# Patient Record
Sex: Male | Born: 1950 | Race: White | Hispanic: No | Marital: Married | Smoking: Never smoker
Health system: Southern US, Community
[De-identification: ages and names within clinical notes are randomized; demographics above are authoritative.]

## PROBLEM LIST (undated history)

## (undated) DIAGNOSIS — N289 Disorder of kidney and ureter, unspecified: Secondary | ICD-10-CM

## (undated) DIAGNOSIS — Z950 Presence of cardiac pacemaker: Secondary | ICD-10-CM

## (undated) DIAGNOSIS — I340 Nonrheumatic mitral (valve) insufficiency: Secondary | ICD-10-CM

## (undated) DIAGNOSIS — F329 Major depressive disorder, single episode, unspecified: Secondary | ICD-10-CM

## (undated) DIAGNOSIS — I10 Essential (primary) hypertension: Secondary | ICD-10-CM

## (undated) DIAGNOSIS — I639 Cerebral infarction, unspecified: Secondary | ICD-10-CM

## (undated) DIAGNOSIS — M170 Bilateral primary osteoarthritis of knee: Secondary | ICD-10-CM

## (undated) DIAGNOSIS — T82857A Stenosis of cardiac prosthetic devices, implants and grafts, initial encounter: Secondary | ICD-10-CM

## (undated) DIAGNOSIS — D72829 Elevated white blood cell count, unspecified: Secondary | ICD-10-CM

## (undated) DIAGNOSIS — I499 Cardiac arrhythmia, unspecified: Secondary | ICD-10-CM

## (undated) DIAGNOSIS — E785 Hyperlipidemia, unspecified: Secondary | ICD-10-CM

## (undated) DIAGNOSIS — M1990 Unspecified osteoarthritis, unspecified site: Secondary | ICD-10-CM

## (undated) DIAGNOSIS — I38 Endocarditis, valve unspecified: Secondary | ICD-10-CM

## (undated) DIAGNOSIS — H532 Diplopia: Secondary | ICD-10-CM

## (undated) DIAGNOSIS — F32A Depression, unspecified: Secondary | ICD-10-CM

## (undated) DIAGNOSIS — N19 Unspecified kidney failure: Secondary | ICD-10-CM

## (undated) DIAGNOSIS — G40209 Localization-related (focal) (partial) symptomatic epilepsy and epileptic syndromes with complex partial seizures, not intractable, without status epilepticus: Secondary | ICD-10-CM

## (undated) DIAGNOSIS — Z961 Presence of intraocular lens: Secondary | ICD-10-CM

## (undated) HISTORY — PX: TONSILLECTOMY: SUR1361

## (undated) HISTORY — PX: HEMORRHOID SURGERY: SHX153

## (undated) HISTORY — PX: COLONOSCOPY: SHX174

## (undated) HISTORY — PX: INSERT / REPLACE / REMOVE PACEMAKER: SUR710

## (undated) HISTORY — PX: EYE SURGERY: SHX253

---

## 2007-02-09 ENCOUNTER — Ambulatory Visit: Payer: Self-pay | Admitting: Unknown Physician Specialty

## 2007-03-28 ENCOUNTER — Inpatient Hospital Stay: Payer: Self-pay | Admitting: Internal Medicine

## 2007-03-28 ENCOUNTER — Other Ambulatory Visit: Payer: Self-pay

## 2007-11-27 ENCOUNTER — Ambulatory Visit: Payer: Self-pay | Admitting: Internal Medicine

## 2008-03-16 ENCOUNTER — Emergency Department: Payer: Self-pay | Admitting: Emergency Medicine

## 2008-11-21 ENCOUNTER — Ambulatory Visit: Payer: Self-pay | Admitting: Unknown Physician Specialty

## 2009-07-13 ENCOUNTER — Ambulatory Visit: Payer: Self-pay | Admitting: Internal Medicine

## 2009-08-01 ENCOUNTER — Ambulatory Visit: Payer: Self-pay | Admitting: Internal Medicine

## 2011-07-31 DIAGNOSIS — I352 Nonrheumatic aortic (valve) stenosis with insufficiency: Secondary | ICD-10-CM | POA: Insufficient documentation

## 2011-07-31 DIAGNOSIS — I340 Nonrheumatic mitral (valve) insufficiency: Secondary | ICD-10-CM | POA: Insufficient documentation

## 2011-07-31 DIAGNOSIS — F32A Depression, unspecified: Secondary | ICD-10-CM | POA: Insufficient documentation

## 2011-07-31 DIAGNOSIS — F329 Major depressive disorder, single episode, unspecified: Secondary | ICD-10-CM | POA: Insufficient documentation

## 2011-07-31 DIAGNOSIS — E785 Hyperlipidemia, unspecified: Secondary | ICD-10-CM | POA: Insufficient documentation

## 2011-07-31 DIAGNOSIS — I071 Rheumatic tricuspid insufficiency: Secondary | ICD-10-CM | POA: Insufficient documentation

## 2011-10-28 HISTORY — PX: CARDIAC VALVE REPLACEMENT: SHX585

## 2011-11-04 HISTORY — PX: AORTIC VALVE REPLACEMENT: SHX41

## 2011-12-10 ENCOUNTER — Inpatient Hospital Stay: Payer: Self-pay | Admitting: Internal Medicine

## 2011-12-10 ENCOUNTER — Ambulatory Visit: Payer: Self-pay | Admitting: Internal Medicine

## 2011-12-10 LAB — COMPREHENSIVE METABOLIC PANEL
Albumin: 3.5 g/dL (ref 3.4–5.0)
Alkaline Phosphatase: 71 U/L (ref 50–136)
Anion Gap: 9 (ref 7–16)
BUN: 25 mg/dL — ABNORMAL HIGH (ref 7–18)
Calcium, Total: 9.2 mg/dL (ref 8.5–10.1)
Creatinine: 1.26 mg/dL (ref 0.60–1.30)
Osmolality: 285 (ref 275–301)
Potassium: 3.8 mmol/L (ref 3.5–5.1)
Sodium: 140 mmol/L (ref 136–145)
Total Protein: 8.1 g/dL (ref 6.4–8.2)

## 2011-12-10 LAB — PROTIME-INR: Prothrombin Time: 14.3 secs (ref 11.5–14.7)

## 2011-12-10 LAB — CBC WITH DIFFERENTIAL/PLATELET
Basophil #: 0.1 10*3/uL (ref 0.0–0.1)
HCT: 31.6 % — ABNORMAL LOW (ref 40.0–52.0)
Lymphocyte #: 0.7 10*3/uL — ABNORMAL LOW (ref 1.0–3.6)
MCH: 31.2 pg (ref 26.0–34.0)
MCHC: 34.1 g/dL (ref 32.0–36.0)
Monocyte #: 0.6 10*3/uL (ref 0.0–0.7)
Monocyte %: 10.5 %
Neutrophil #: 4 10*3/uL (ref 1.4–6.5)
Neutrophil %: 65.1 %
Platelet: 130 10*3/uL — ABNORMAL LOW (ref 150–440)
RBC: 3.46 10*6/uL — ABNORMAL LOW (ref 4.40–5.90)
RDW: 15.4 % — ABNORMAL HIGH (ref 11.5–14.5)
WBC: 6.1 10*3/uL (ref 3.8–10.6)

## 2011-12-10 LAB — TROPONIN I: Troponin-I: <0.02 ng/mL

## 2011-12-10 LAB — CK TOTAL AND CKMB (NOT AT ARMC): CK, Total: 37 U/L (ref 35–232)

## 2011-12-11 LAB — BASIC METABOLIC PANEL
BUN: 20 mg/dL — ABNORMAL HIGH (ref 7–18)
Chloride: 106 mmol/L (ref 98–107)
Co2: 23 mmol/L (ref 21–32)
EGFR (Non-African Amer.): >60
Osmolality: 286 (ref 275–301)
Potassium: 3.9 mmol/L (ref 3.5–5.1)

## 2011-12-11 LAB — CBC WITH DIFFERENTIAL/PLATELET
Basophil %: 1.8 %
Eosinophil %: 11.6 %
HCT: 30.2 % — ABNORMAL LOW (ref 40.0–52.0)
HGB: 10.4 g/dL — ABNORMAL LOW (ref 13.0–18.0)
Lymphocyte %: 17.6 %
MCH: 31.2 pg (ref 26.0–34.0)
MCV: 90 fL (ref 80–100)
Monocyte #: 0.7 10*3/uL (ref 0.0–0.7)
Monocyte %: 14.1 %
Neutrophil %: 54.9 %
Platelet: 108 10*3/uL — ABNORMAL LOW (ref 150–440)
RBC: 3.34 10*6/uL — ABNORMAL LOW (ref 4.40–5.90)
RDW: 15.5 % — ABNORMAL HIGH (ref 11.5–14.5)
WBC: 5.2 10*3/uL (ref 3.8–10.6)

## 2011-12-11 LAB — URINALYSIS, COMPLETE
Bacteria: NONE SEEN
Blood: NEGATIVE
Glucose,UR: NEGATIVE mg/dL (ref 0–75)
Ketone: NEGATIVE
Ph: 6 (ref 4.5–8.0)
RBC,UR: <1 /HPF (ref 0–5)
Squamous Epithelial: NONE SEEN
WBC UR: 1 /HPF (ref 0–5)

## 2011-12-11 LAB — TROPONIN I
Troponin-I: <0.02 ng/mL
Troponin-I: <0.02 ng/mL

## 2011-12-11 LAB — CK TOTAL AND CKMB (NOT AT ARMC)
CK, Total: 30 U/L — ABNORMAL LOW (ref 35–232)
CK, Total: 33 U/L — ABNORMAL LOW (ref 35–232)
CK-MB: <0.5 ng/mL — ABNORMAL LOW (ref 0.5–3.6)

## 2011-12-11 LAB — APTT: Activated PTT: >160 secs (ref 23.6–35.9)

## 2011-12-11 LAB — CLOSTRIDIUM DIFFICILE BY PCR

## 2011-12-11 LAB — PROTIME-INR: INR: 1.1

## 2011-12-12 LAB — APTT: Activated PTT: 78.1 secs — ABNORMAL HIGH (ref 23.6–35.9)

## 2011-12-12 LAB — PROTIME-INR: Prothrombin Time: 16.2 secs — ABNORMAL HIGH (ref 11.5–14.7)

## 2011-12-13 LAB — CBC WITH DIFFERENTIAL/PLATELET
Basophil #: 0.1 10*3/uL (ref 0.0–0.1)
Eosinophil #: 0.7 10*3/uL (ref 0.0–0.7)
Eosinophil %: 14.2 %
HCT: 32.1 % — ABNORMAL LOW (ref 40.0–52.0)
HGB: 11.1 g/dL — ABNORMAL LOW (ref 13.0–18.0)
Lymphocyte #: 0.7 10*3/uL — ABNORMAL LOW (ref 1.0–3.6)
Lymphocyte %: 15.7 %
MCH: 30.8 pg (ref 26.0–34.0)
MCHC: 34.4 g/dL (ref 32.0–36.0)
MCV: 90 fL (ref 80–100)
Monocyte #: 0.7 10*3/uL (ref 0.0–0.7)
Neutrophil #: 2.4 10*3/uL (ref 1.4–6.5)
RBC: 3.59 10*6/uL — ABNORMAL LOW (ref 4.40–5.90)
RDW: 15.5 % — ABNORMAL HIGH (ref 11.5–14.5)

## 2011-12-13 LAB — PROTIME-INR: INR: 1.4

## 2011-12-13 LAB — COMPREHENSIVE METABOLIC PANEL
Albumin: 3 g/dL — ABNORMAL LOW (ref 3.4–5.0)
Alkaline Phosphatase: 71 U/L (ref 50–136)
Anion Gap: 13 (ref 7–16)
BUN: 17 mg/dL (ref 7–18)
Chloride: 106 mmol/L (ref 98–107)
Creatinine: 1.31 mg/dL — ABNORMAL HIGH (ref 0.60–1.30)
EGFR (African American): >60
Glucose: 109 mg/dL — ABNORMAL HIGH (ref 65–99)
Osmolality: 285 (ref 275–301)
Potassium: 3.9 mmol/L (ref 3.5–5.1)
SGOT(AST): 33 U/L (ref 15–37)
SGPT (ALT): 34 U/L
Total Protein: 7.3 g/dL (ref 6.4–8.2)

## 2011-12-13 LAB — LIPASE, BLOOD: Lipase: 181 U/L (ref 73–393)

## 2011-12-13 LAB — CLOSTRIDIUM DIFFICILE BY PCR

## 2011-12-14 LAB — COMPREHENSIVE METABOLIC PANEL
Albumin: 3.1 g/dL — ABNORMAL LOW (ref 3.4–5.0)
Alkaline Phosphatase: 65 U/L (ref 50–136)
BUN: 18 mg/dL (ref 7–18)
Bilirubin,Total: 1.4 mg/dL — ABNORMAL HIGH (ref 0.2–1.0)
Chloride: 107 mmol/L (ref 98–107)
Creatinine: 1.28 mg/dL (ref 0.60–1.30)
EGFR (African American): >60
Osmolality: 287 (ref 275–301)
SGPT (ALT): 34 U/L
Total Protein: 6.6 g/dL (ref 6.4–8.2)

## 2011-12-14 LAB — CBC WITH DIFFERENTIAL/PLATELET
Basophil %: 1.8 %
Eosinophil %: 15.3 %
Lymphocyte %: 17.4 %
MCH: 31.1 pg (ref 26.0–34.0)
MCHC: 34.2 g/dL (ref 32.0–36.0)
MCV: 91 fL (ref 80–100)
Monocyte %: 15.3 %
Neutrophil #: 2 10*3/uL (ref 1.4–6.5)
Neutrophil %: 50.2 %
Platelet: 117 10*3/uL — ABNORMAL LOW (ref 150–440)
RBC: 3.42 10*6/uL — ABNORMAL LOW (ref 4.40–5.90)

## 2011-12-14 LAB — APTT: Activated PTT: 114.2 secs — ABNORMAL HIGH (ref 23.6–35.9)

## 2011-12-15 LAB — COMPREHENSIVE METABOLIC PANEL
Albumin: 3.1 g/dL — ABNORMAL LOW (ref 3.4–5.0)
Anion Gap: 14 (ref 7–16)
BUN: 17 mg/dL (ref 7–18)
Bilirubin,Total: 1.4 mg/dL — ABNORMAL HIGH (ref 0.2–1.0)
Calcium, Total: 9.1 mg/dL (ref 8.5–10.1)
Chloride: 105 mmol/L (ref 98–107)
Creatinine: 1.24 mg/dL (ref 0.60–1.30)
EGFR (African American): >60
Osmolality: 287 (ref 275–301)
Potassium: 3.8 mmol/L (ref 3.5–5.1)
SGPT (ALT): 40 U/L
Total Protein: 7.1 g/dL (ref 6.4–8.2)

## 2011-12-15 LAB — CBC WITH DIFFERENTIAL/PLATELET
Basophil #: 0.1 10*3/uL (ref 0.0–0.1)
Basophil %: 1.5 %
Eosinophil #: 0.6 10*3/uL (ref 0.0–0.7)
Eosinophil %: 13 %
HGB: 10.5 g/dL — ABNORMAL LOW (ref 13.0–18.0)
MCH: 31 pg (ref 26.0–34.0)
MCHC: 34.3 g/dL (ref 32.0–36.0)
MCV: 91 fL (ref 80–100)
Monocyte #: 0.7 10*3/uL (ref 0.0–0.7)
Neutrophil #: 2.4 10*3/uL (ref 1.4–6.5)
Neutrophil %: 54.5 %
RDW: 15.7 % — ABNORMAL HIGH (ref 11.5–14.5)

## 2011-12-15 LAB — PROTIME-INR: Prothrombin Time: 17 secs — ABNORMAL HIGH (ref 11.5–14.7)

## 2012-01-07 ENCOUNTER — Encounter: Payer: Self-pay | Admitting: Cardiothoracic Surgery

## 2012-01-11 ENCOUNTER — Emergency Department: Payer: Self-pay | Admitting: Emergency Medicine

## 2012-01-11 LAB — COMPREHENSIVE METABOLIC PANEL
Albumin: 3.7 g/dL (ref 3.4–5.0)
Alkaline Phosphatase: 80 U/L (ref 50–136)
Anion Gap: 13 (ref 7–16)
BUN: 24 mg/dL — ABNORMAL HIGH (ref 7–18)
Bilirubin,Total: 0.8 mg/dL (ref 0.2–1.0)
Chloride: 106 mmol/L (ref 98–107)
Creatinine: 1.17 mg/dL (ref 0.60–1.30)
EGFR (African American): >60
Glucose: 108 mg/dL — ABNORMAL HIGH (ref 65–99)
Potassium: 4.2 mmol/L (ref 3.5–5.1)
SGOT(AST): 35 U/L (ref 15–37)
SGPT (ALT): 33 U/L
Total Protein: 8 g/dL (ref 6.4–8.2)

## 2012-01-11 LAB — CK TOTAL AND CKMB (NOT AT ARMC): CK-MB: 0.6 ng/mL (ref 0.5–3.6)

## 2012-01-11 LAB — PROTIME-INR: Prothrombin Time: 14.1 secs (ref 11.5–14.7)

## 2012-01-11 LAB — TROPONIN I: Troponin-I: <0.02 ng/mL

## 2012-01-11 LAB — CBC
HCT: 33.8 % — ABNORMAL LOW (ref 40.0–52.0)
HGB: 11.8 g/dL — ABNORMAL LOW (ref 13.0–18.0)
MCHC: 34.8 g/dL (ref 32.0–36.0)
Platelet: 110 10*3/uL — ABNORMAL LOW (ref 150–440)
WBC: 7.1 10*3/uL (ref 3.8–10.6)

## 2012-01-13 DIAGNOSIS — Z952 Presence of prosthetic heart valve: Secondary | ICD-10-CM | POA: Insufficient documentation

## 2012-01-26 ENCOUNTER — Encounter: Payer: Self-pay | Admitting: Cardiothoracic Surgery

## 2012-02-02 ENCOUNTER — Ambulatory Visit: Payer: Self-pay | Admitting: Internal Medicine

## 2012-02-02 LAB — CREATININE, SERUM
Creatinine: 1.25 mg/dL (ref 0.60–1.30)
EGFR (African American): >60
EGFR (Non-African Amer.): >60

## 2012-02-04 ENCOUNTER — Observation Stay: Payer: Self-pay | Admitting: Internal Medicine

## 2012-02-04 LAB — COMPREHENSIVE METABOLIC PANEL
Albumin: 3.5 g/dL (ref 3.4–5.0)
Alkaline Phosphatase: 89 U/L (ref 50–136)
BUN: 21 mg/dL — ABNORMAL HIGH (ref 7–18)
Chloride: 102 mmol/L (ref 98–107)
Creatinine: 1.06 mg/dL (ref 0.60–1.30)
EGFR (African American): >60
EGFR (Non-African Amer.): >60
Glucose: 108 mg/dL — ABNORMAL HIGH (ref 65–99)
Osmolality: 279 (ref 275–301)
Potassium: 4 mmol/L (ref 3.5–5.1)
SGOT(AST): 38 U/L — ABNORMAL HIGH (ref 15–37)
Sodium: 138 mmol/L (ref 136–145)
Total Protein: 7.4 g/dL (ref 6.4–8.2)

## 2012-02-04 LAB — TROPONIN I
Troponin-I: <0.02 ng/mL
Troponin-I: <0.02 ng/mL

## 2012-02-04 LAB — CBC
HCT: 35.5 % — ABNORMAL LOW (ref 40.0–52.0)
MCH: 30.5 pg (ref 26.0–34.0)
MCHC: 34.4 g/dL (ref 32.0–36.0)
MCV: 89 fL (ref 80–100)
Platelet: 143 10*3/uL — ABNORMAL LOW (ref 150–440)
RDW: 13.3 % (ref 11.5–14.5)
WBC: 12 10*3/uL — ABNORMAL HIGH (ref 3.8–10.6)

## 2012-02-04 LAB — CK TOTAL AND CKMB (NOT AT ARMC)
CK, Total: 50 U/L (ref 35–232)
CK, Total: 48 U/L (ref 35–232)
CK-MB: <0.5 ng/mL — ABNORMAL LOW (ref 0.5–3.6)

## 2012-02-05 LAB — CBC WITH DIFFERENTIAL/PLATELET
Basophil #: 0.1 10*3/uL (ref 0.0–0.1)
Eosinophil #: 0.2 10*3/uL (ref 0.0–0.7)
Eosinophil %: 1.6 %
Lymphocyte %: 10.7 %
MCHC: 34.2 g/dL (ref 32.0–36.0)
Monocyte #: 1.4 x10 3/mm — ABNORMAL HIGH (ref 0.2–1.0)
Neutrophil %: 76.3 %
Platelet: 150 10*3/uL (ref 150–440)
RBC: 4.28 10*6/uL — ABNORMAL LOW (ref 4.40–5.90)

## 2012-02-05 LAB — BASIC METABOLIC PANEL
Anion Gap: 7 (ref 7–16)
Calcium, Total: 9.6 mg/dL (ref 8.5–10.1)
Co2: 30 mmol/L (ref 21–32)
Creatinine: 1.07 mg/dL (ref 0.60–1.30)
EGFR (African American): >60
EGFR (Non-African Amer.): >60
Glucose: 108 mg/dL — ABNORMAL HIGH (ref 65–99)
Osmolality: 274 (ref 275–301)
Potassium: 4.2 mmol/L (ref 3.5–5.1)
Sodium: 136 mmol/L (ref 136–145)

## 2012-02-05 LAB — CK TOTAL AND CKMB (NOT AT ARMC): CK-MB: <0.5 ng/mL — ABNORMAL LOW (ref 0.5–3.6)

## 2012-02-05 LAB — PROTIME-INR: Prothrombin Time: 16.6 secs — ABNORMAL HIGH (ref 11.5–14.7)

## 2012-02-05 LAB — TROPONIN I: Troponin-I: <0.02 ng/mL

## 2012-02-06 LAB — BASIC METABOLIC PANEL
Anion Gap: 9 (ref 7–16)
Calcium, Total: 9.5 mg/dL (ref 8.5–10.1)
Chloride: 101 mmol/L (ref 98–107)
Creatinine: 1.11 mg/dL (ref 0.60–1.30)
EGFR (African American): >60
EGFR (Non-African Amer.): >60
Glucose: 98 mg/dL (ref 65–99)
Potassium: 4.1 mmol/L (ref 3.5–5.1)
Sodium: 137 mmol/L (ref 136–145)

## 2012-02-06 LAB — CBC WITH DIFFERENTIAL/PLATELET
Basophil %: 0.7 %
Eosinophil #: 0.2 10*3/uL (ref 0.0–0.7)
HCT: 38.5 % — ABNORMAL LOW (ref 40.0–52.0)
HGB: 13.2 g/dL (ref 13.0–18.0)
Lymphocyte #: 1.1 10*3/uL (ref 1.0–3.6)
Lymphocyte %: 12.8 %
MCHC: 34.3 g/dL (ref 32.0–36.0)
MCV: 88 fL (ref 80–100)
Neutrophil #: 5.9 10*3/uL (ref 1.4–6.5)
Neutrophil %: 71.9 %
Platelet: 151 10*3/uL (ref 150–440)
RBC: 4.39 10*6/uL — ABNORMAL LOW (ref 4.40–5.90)
RDW: 13.4 % (ref 11.5–14.5)
WBC: 8.3 10*3/uL (ref 3.8–10.6)

## 2012-02-25 ENCOUNTER — Encounter: Payer: Self-pay | Admitting: Cardiothoracic Surgery

## 2012-05-19 IMAGING — CR DG CHEST 2V
1 series · 2 of 2 positions shown · non-contrast
Comparison: none

REASON FOR EXAM: hx of AVR, Rt leg DVT
COMMENTS:

[Series 1: pa · 0.17mm/px · 2 of 2 slices shown]
[im 1/2]
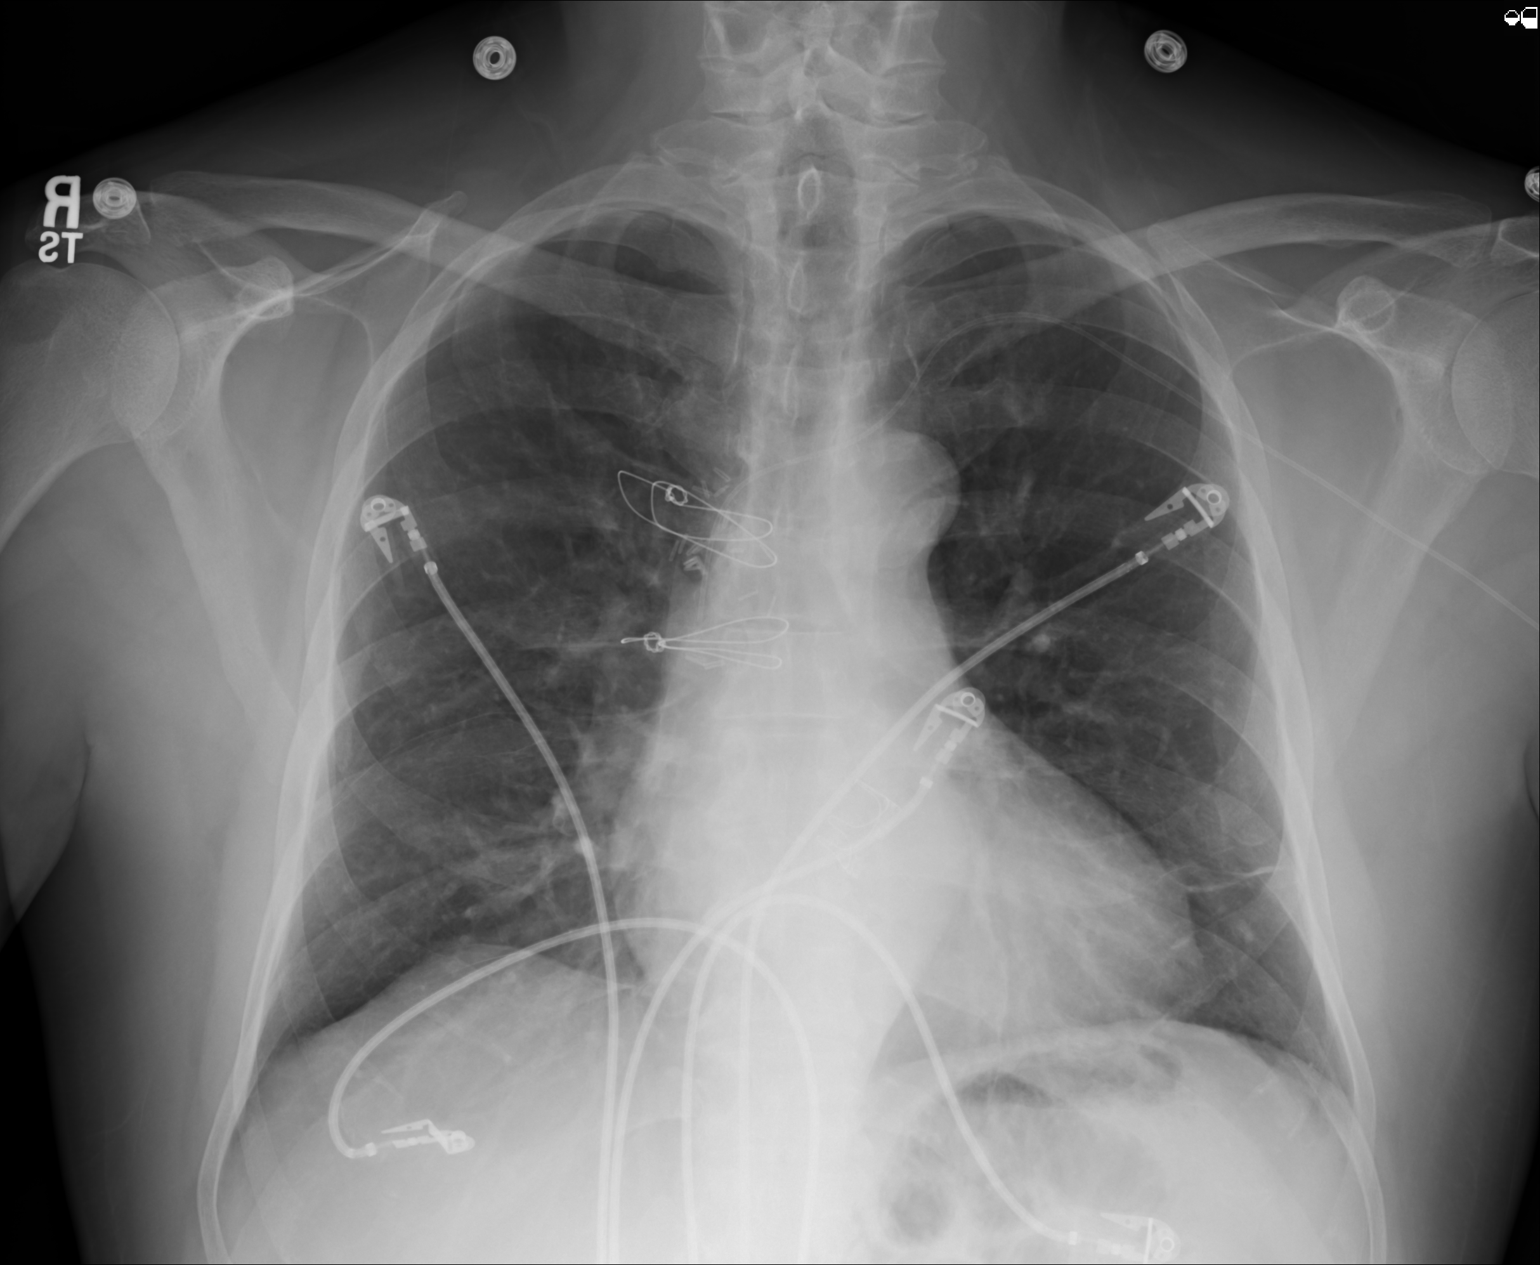
[im 2/2]
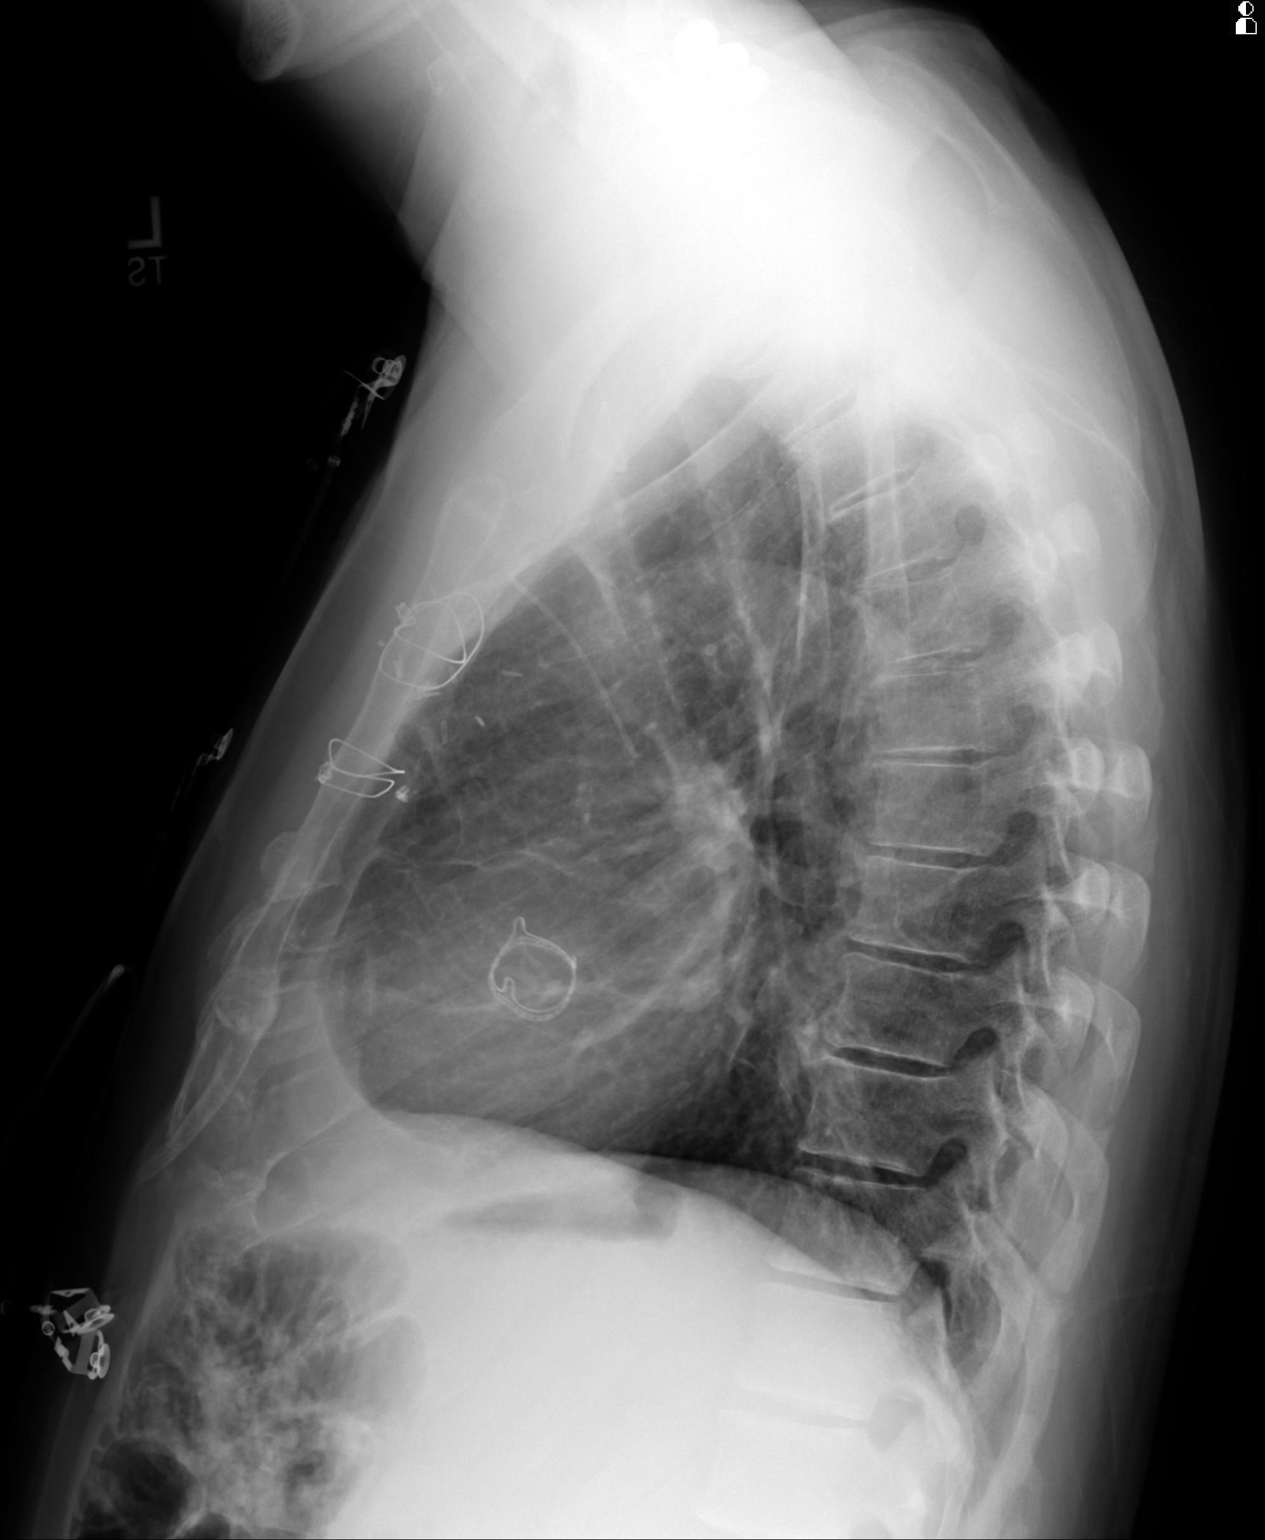

[2 of 2 positions shown; findings below may reference images not displayed]

PROCEDURE:     DXR - DXR CHEST PA (OR AP) AND LATERAL  - December 10, 2011  [DATE]

RESULT:     There is no previous exam for comparison.

Heart is mildly enlarged. There is a peripherally inserted central catheter
from the left upper extremity with the tip in the superior vena cava. There
is no edema, infiltrate, effusion or pneumothorax. Prosthetic cardiac valve
is noted. The bony structures appear intact.
IMPRESSION: Mild cardiomegaly. No acute cardiopulmonary disease evident.

## 2012-06-15 ENCOUNTER — Ambulatory Visit: Payer: Self-pay | Admitting: Internal Medicine

## 2012-09-13 ENCOUNTER — Ambulatory Visit: Payer: Self-pay | Admitting: Unknown Physician Specialty

## 2012-09-14 LAB — PATHOLOGY REPORT

## 2012-12-22 HISTORY — PX: OTHER SURGICAL HISTORY: SHX169

## 2013-01-31 ENCOUNTER — Emergency Department: Payer: Self-pay | Admitting: Emergency Medicine

## 2013-01-31 LAB — CBC
HCT: 39 % — ABNORMAL LOW (ref 40.0–52.0)
HGB: 13.7 g/dL (ref 13.0–18.0)
MCH: 31.5 pg (ref 26.0–34.0)
MCV: 90 fL (ref 80–100)
Platelet: 118 10*3/uL — ABNORMAL LOW (ref 150–440)
RBC: 4.35 10*6/uL — ABNORMAL LOW (ref 4.40–5.90)

## 2013-01-31 LAB — BASIC METABOLIC PANEL
Anion Gap: 14 (ref 7–16)
BUN: 21 mg/dL — ABNORMAL HIGH (ref 7–18)
Calcium, Total: 9.2 mg/dL (ref 8.5–10.1)
Co2: 20 mmol/L — ABNORMAL LOW (ref 21–32)
EGFR (African American): 58 — ABNORMAL LOW
EGFR (Non-African Amer.): 50 — ABNORMAL LOW
Glucose: 166 mg/dL — ABNORMAL HIGH (ref 65–99)
Potassium: 3.7 mmol/L (ref 3.5–5.1)
Sodium: 137 mmol/L (ref 136–145)

## 2013-03-03 ENCOUNTER — Ambulatory Visit: Payer: Self-pay | Admitting: Neurology

## 2013-03-03 LAB — CREATININE, SERUM
Creatinine: 1.37 mg/dL — ABNORMAL HIGH (ref 0.60–1.30)
EGFR (Non-African Amer.): 55 — ABNORMAL LOW

## 2013-06-27 HISTORY — PX: OTHER SURGICAL HISTORY: SHX169

## 2013-09-27 HISTORY — PX: OTHER SURGICAL HISTORY: SHX169

## 2013-11-10 DIAGNOSIS — Z8673 Personal history of transient ischemic attack (TIA), and cerebral infarction without residual deficits: Secondary | ICD-10-CM | POA: Insufficient documentation

## 2013-11-11 DIAGNOSIS — G40209 Localization-related (focal) (partial) symptomatic epilepsy and epileptic syndromes with complex partial seizures, not intractable, without status epilepticus: Secondary | ICD-10-CM | POA: Insufficient documentation

## 2014-04-14 ENCOUNTER — Inpatient Hospital Stay: Payer: Self-pay | Admitting: Internal Medicine

## 2014-04-14 LAB — DRUG SCREEN, URINE

## 2014-04-14 LAB — ETHANOL

## 2014-04-14 LAB — CBC
HCT: 45.6 % (ref 40.0–52.0)
HGB: 15.8 g/dL (ref 13.0–18.0)
MCH: 31.8 pg (ref 26.0–34.0)
MCHC: 34.6 g/dL (ref 32.0–36.0)
MCV: 92 fL (ref 80–100)
Platelet: 106 10*3/uL — ABNORMAL LOW (ref 150–440)
RBC: 4.95 10*6/uL (ref 4.40–5.90)
RDW: 13.1 % (ref 11.5–14.5)
WBC: 9.6 10*3/uL (ref 3.8–10.6)

## 2014-04-14 LAB — COMPREHENSIVE METABOLIC PANEL
ALK PHOS: 85 U/L
Albumin: 4.4 g/dL (ref 3.4–5.0)
Anion Gap: 10 (ref 7–16)
BILIRUBIN TOTAL: 1.2 mg/dL — AB (ref 0.2–1.0)
BUN: 27 mg/dL — ABNORMAL HIGH (ref 7–18)
CREATININE: 1.13 mg/dL (ref 0.60–1.30)
Calcium, Total: 9.4 mg/dL (ref 8.5–10.1)
Chloride: 103 mmol/L (ref 98–107)
Co2: 24 mmol/L (ref 21–32)
Glucose: 88 mg/dL (ref 65–99)
Osmolality: 278 (ref 275–301)
Potassium: 5.3 mmol/L — ABNORMAL HIGH (ref 3.5–5.1)
SGOT(AST): 68 U/L — ABNORMAL HIGH (ref 15–37)
SGPT (ALT): 37 U/L (ref 12–78)
SODIUM: 137 mmol/L (ref 136–145)
Total Protein: 8.6 g/dL — ABNORMAL HIGH (ref 6.4–8.2)

## 2014-04-14 LAB — ACETAMINOPHEN LEVEL

## 2014-04-14 LAB — SALICYLATE LEVEL: Salicylates, Serum: <1.7 mg/dL

## 2014-07-24 ENCOUNTER — Ambulatory Visit: Payer: Self-pay | Admitting: Internal Medicine

## 2014-10-23 DIAGNOSIS — H5034 Intermittent alternating exotropia: Secondary | ICD-10-CM | POA: Insufficient documentation

## 2014-11-16 DIAGNOSIS — I10 Essential (primary) hypertension: Secondary | ICD-10-CM | POA: Insufficient documentation

## 2014-12-06 DIAGNOSIS — I38 Endocarditis, valve unspecified: Secondary | ICD-10-CM | POA: Insufficient documentation

## 2015-01-05 DIAGNOSIS — M170 Bilateral primary osteoarthritis of knee: Secondary | ICD-10-CM | POA: Insufficient documentation

## 2015-02-17 NOTE — Discharge Summary (Signed)
PATIENT NAME:  Johnny [name], Johnny [name] MR#:  [id number] DATE OF BIRTH:  03-15-1951  DATE OF ADMISSION:  04/14/2014 DATE OF DISCHARGE:  04/14/2014  DIAGNOSES AT TIME OF DISCHARGE: 1.  Acute seizure.  2.  Hyperkalemia.  3.  Hypertension.  4.  Hyperlipidemia.  5.  History of vitamin D deficiency.  6.  Anxiety.    CHIEF COMPLAINT: Seizures.   HISTORY OF PRESENT ILLNESS: The patient is a 64 year old male with a past medical history significant for [facility], recent seizures, who presented to the ED after another episode of seizure. The patient's first episode was in April 2014 and the second one a year later in April 2015. The patient had been seen by neurologist, Dr. Sherryll BurgerShah, and EEG showed evidence of seizure waves. The patient himself had declined seizure medications until this time, but, however, a seizure was witnessed by his wife at around 2:30 a.m., when the patient had a tonic-clonic seizure while he was sleeping in bed and had transient global amnesia afterward. He did not sustain  injuries. He was brought to the ED. CT head was negative. He was given 1 gram of IV Cerebyx and was also seen by Dr. Sherryll BurgerShah as a courtesy visit, who advised that he can be discharged on Keppra.   PAST MEDICAL HISTORY: Significant for hypertension, hyperlipidemia, history of bovine aortic valve replacement, history of previous deep vein thrombosis, history of childhood asthma, history of aortic valve stenosis, marantic endocarditis, bovine aortic valve replacement, and tonsillectomy. Please see H and P for other details.   HOSPITAL COURSE: The patient did not have any further episodes of seizures, and he was stable during his stay in the hospital. He was discharged in stable condition on the following medications: Keppra 1000 mg p.o. b.i.d. for three days followed by Keppra 500 mg 1 tablet p.o. b.i.d., diphenhydramine 25 mg orally as needed, ketorolac 0.5% ophthalmic solution one drop each eye 4 times  a day, aspirin 81 mg a day, Lipitor 10 mg once a day at bedtime, vitamin D3 1000 units 1 capsule once a day, fish oil 1 capsule on Tuesday, Thursday, on Saturday, multivitamin 1 tablet once a day. The patient was advised on a low-sodium diet and follow up with me, Dr. Marcello FennelHande, and also follow up with Dr. Sherryll BurgerShah, the neurologist, in 1 to 2 weeks' time. He was advised to call back with any questions or concerns. He was stable at the time of discharge.   TOTAL TIME SPENT DISCHARGING PATIENT: 30 minutes    ____________________________ Barbette ReichmannVishwanath Hande, MD vh:cg D: 04/17/2014 19:11:07 ET T: 04/18/2014 04:50:08 ET JOB#: [id number]  cc: Barbette ReichmannVishwanath Hande, MD, <Dictator> Barbette ReichmannVISHWANATH HANDE MD ELECTRONICALLY SIGNED 04/26/2014 18:11

## 2015-02-17 NOTE — H&P (Signed)
PATIENT NAME:  Johnny [name], Johnny [name] MR#:  [id number] DATE OF BIRTH:  05-01-1951  DATE OF ADMISSION:  04/14/2014  PRIMARY CARE PHYSICIAN: Barbette Reichmann, MD  CHIEF COMPLAINT: Seizure.   HISTORY OF PRESENT ILLNESS: The patient is a 64 year old Caucasian male with a past medical history of bovine aortic valve replacement at [facility] and recent history of seizures, who is presenting to the ED with another episode of seizure. The patient had first episode of seizure in April 2014 and second one in April 2015. The patient was seen by neurologist, Dr. Sherryll Burger, and had EEG done, which was positive for seizure waves. The patient also is supposed to get MRI of the brain, but because of claustrophobia, he could not get it done. Dr. Sherryll Burger thought of starting him on seizure medications, but as the seizures are assumed to be from some immunological problem, they did not start him on any medications, as per the patient's history. Tonight at around 2:30 a.m., the patient had a tonic-clonic seizure while he was sleeping in the bed. Yesterday, he was having transient global amnesia intermittently, as reported by the wife. He was having jerky movements of all extremities and body, but no urinary incontinence or biting of the tongue. As the patient was in the bed, he did not sustain any injuries. The patient was immediately brought into the ED. CT head is negative. The patient was loaded up with 1 gram of Cerebyx in the ED. Currently, the patient is in postictal stage, very lethargic, but opening his eyes, but falling asleep. During my examination, wife is at bedside. History is obtained from the patient's wife. His blood pressure is elevated at 180/110, and he has received labetalol 10 mg IV.   PAST MEDICAL HISTORY:  1. Hypertension.  2. Hyperlipidemia.  3. History of bovine aortic valve replacement.  4. History of DVT, not on any anticoagulant at this time.  5. History of childhood asthma. 6. 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   PAST SURGICAL HISTORY:  1. Hemorrhoidectomy. 2. Tonsillectomy.  3. Bovine aortic valve replacement.  ALLERGIES: ALLERGIC TO CRESTOR AND VICODIN.   PSYCHOSOCIAL HISTORY: Lives at home with wife. No history of smoking, alcohol or illicit drug usage. He is currently retired.   FAMILY HISTORY: Father has COPD.   REVIEW OF SYSTEMS: Unobtainable as the patient is lethargic.   HOME MEDICATIONS:  1. Vitamin D3 1000 international units 1 capsule p.o. once daily.  2. Tylenol Extra Strength 1 to 2 tablets p.o. once daily.  3. Multivitamin 1 tablet p.o. once daily. 4. Lipitor 10 mg once daily.  5. Fish oil 1000 mg 1 capsule p.o. on Tuesday, Thursday and Saturday.  6. Benadryl 25 mg 1 to 2 capsules p.o. once a day.   PHYSICAL EXAMINATION: VITAL SIGNS: Temperature 97.5, pulse 78, respirations 18, blood pressure 151/92, pulse oximetry 99%.  GENERAL APPEARANCE: Not under acute distress. Moderately built and nourished.  HEENT: Normocephalic, atraumatic. Pupils are equally reacting to light and accommodation. No scleral icterus. No conjunctival injection. No sinus tenderness. Moist mucous membranes.  NECK: Supple. No JVD. No thyromegaly. Range of motion is intact.  LUNGS: Clear to auscultation bilaterally. No accessory muscle usage. No anterior chest wall tenderness on palpation.  CARDIAC: S1, S2 normal. Regular rate and rhythm. Positive murmur and click.  GASTROINTESTINAL: Soft. Bowel sounds are positive in all 4 quadrants. Nontender, nondistended. No hepatosplenomegaly. No masses.  NEUROLOGIC: Arousable, but very lethargic as he is in postictal state. Reflexes are  2+.  MUSCULOSKELETAL: No joint effusion or tenderness. No erythema.  PSYCHIATRIC: Mood and affect could not be elicited as the patient is lethargic.   LABORATORY AND IMAGING STUDIES:  CAT scan of the head without contrast has revealed no acute intracranial pathology. 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 Small mucus retention cyst or polyp in the right maxillary sinus.  Accu-Chek is 97. BMP: Glucose is normal, BUN 27, creatinine and sodium are normal, potassium at 5.3, chloride 103, CO2 and GFR are normal. The rest of the Chem-8 is normal. LFTs: Total protein is 8.6, bilirubin total is 1.2, albumin is normal, alkaline phosphatase is normal, AST is elevated at 68. ALT is 37. CBC is normal except platelet count at 106. Acetaminophen level is less than 2, salicylate 1.7.   ASSESSMENT AND PLAN: A 64 year old male brought into the ED after he had 1 episode of tonic-clonic seizure. Will be admitted with the following assessment and plan.   1. Acute episode of seizure, with known history of seizures. One dose of IV Cerebyx was given as loading dose. Will continue Cerebyx 100 mg IV q.8 hours. Neurology consult is placed. The patient will get neuro checks, and he will be on seizure precautions.  2. Hyperkalemia, probably from jerky movements during the seizure episode. Will provide small dose of Kayexalate.  3. Hypertension. Will resume his home medications. 4. Hyperlipidemia. Continue fish oil and statin.  5. Vitamin D deficiency. Continue vitamin D.  6. Will provide gastrointestinal and deep vein thrombosis prophylaxis.   CODE STATUS: He is full code. Wife is the medical power of attorney.   Transfer the patient to Dr. Marcello FennelHande.   TOTAL TIME SPENT ON ADMISSION: 50 minutes.   Diagnosis and plan of care were discussed with the patient's wife at bedside. She is aware of the plan.   ____________________________ Ramonita LabAruna Duke Weisensel, MD ag:lb D: 04/14/2014 07:37:19 ET T: 04/14/2014 07:55:49 ET JOB#: [id number]  cc: Ramonita LabAruna Jerlene Rockers, MD, <Dictator> Barbette ReichmannVishwanath Hande, MD Ramonita LabARUNA Ruhaan Nordahl MD ELECTRONICALLY SIGNED 04/16/2014 1:00

## 2015-02-18 NOTE — Consult Note (Signed)
Chief Complaint:   Subjective/Chief Complaint Feels much better. No abdominal pain or diarrhea. Abdominal examination is benign.   VITAL SIGNS/ANCILLARY NOTES: **Vital Signs.:   16-Feb-13 09:12   Vital Signs Type Routine   Temperature Temperature (F) 97.3   Celsius 36.2   Temperature Source oral   Pulse Pulse 106   Pulse source per Dinamap   Systolic BP Systolic BP 333   Diastolic BP (mmHg) Diastolic BP (mmHg) 80   Mean BP 98   BP Source Dinamap   Pulse Ox % Pulse Ox % 99   Pulse Ox Activity Level  At rest   Oxygen Delivery Room Air/ 21 %   Routine Hem:  16-Feb-13 04:22    WBC (CBC) 4.6   RBC (CBC) 3.59   Hemoglobin (CBC) 11.1   Hematocrit (CBC) 32.1   Platelet Count (CBC) 120   MCV 90   MCH 30.8   MCHC 34.4   RDW 15.5   Neutrophil % 52.8   Lymphocyte % 15.7   Monocyte % 15.4   Eosinophil % 14.2   Basophil % 1.9   Neutrophil # 2.4   Lymphocyte # 0.7   Monocyte # 0.7   Eosinophil # 0.7   Basophil # 0.1  Routine Coag:  16-Feb-13 04:22    Prothrombin 17.2   INR 1.4  Routine Chem:  16-Feb-13 04:22    Glucose, Serum 109   BUN 17   Creatinine (comp) 1.31   Sodium, Serum 142   Potassium, Serum 3.9   Chloride, Serum 106   CO2, Serum 23   Calcium (Total), Serum 9.0  Hepatic:  16-Feb-13 04:22    Bilirubin, Total 1.4   Alkaline Phosphatase 71   SGPT (ALT) 34   SGOT (AST) 33   Total Protein, Serum 7.3   Albumin, Serum 3.0  Routine Chem:  16-Feb-13 04:22    Osmolality (calc) 285   eGFR (African American) >60   eGFR (Non-African American) 59   Anion Gap 13  Routine Coag:  16-Feb-13 04:22    Activated PTT (APTT) 94.4  Routine Chem:  16-Feb-13 04:22    Lipase 181  Routine Micro:  16-Feb-13 14:41    Specimen Source STOOL   Assessment/Plan:  Assessment/Plan:   Assessment LLQ abdominal pain with normal CT and negative C. diff toxin. Patient feels well now on Flagyl.    Plan Continue Flagyl for a total of 7 days.   Electronic Signatures: Jill Side (MD)  (Signed 16-Feb-13 11:16)  Authored: Chief Complaint, VITAL SIGNS/ANCILLARY NOTES, Lab Results, Assessment/Plan   Last Updated: 16-Feb-13 11:16 by Jill Side (MD)

## 2015-02-18 NOTE — Consult Note (Signed)
Brief Consult Note: Diagnosis: Abdominal pain and diarrhea.   Patient was seen by consultant.   Discussed with Attending MD.   Comments: LLQ abdominal pain. CT scan unremarkable. Positive h/o antibiotic use raising concerns about possible C. diff colitis although C. diff toxin is negative. Will emperically treat with Flagyl. Further recommendations to follow. Thanks.  Electronic Signatures: Lurline DelIftikhar, Jasper Hanf (MD)  (Signed 15-Feb-13 14:53)  Authored: Brief Consult Note   Last Updated: 15-Feb-13 14:53 by Lurline DelIftikhar, Idara Woodside (MD)

## 2015-02-18 NOTE — H&P (Signed)
PATIENT NAME:  Johnny [name], Johnny [name] MR#:  [id number] DATE OF BIRTH:  14-Apr-1951  DATE OF ADMISSION:  12/10/2011  CHIEF COMPLAINT: Pain in the right calf associated with swelling.   HISTORY OF PRESENT ILLNESS: Johnny [name] is a 64 year old male with a history of [facility] who presented to the clinic complaining of pain in the right calf for the last 4 to 5 days. The patient also has noticed significant swelling and states that the calf is excruciatingly tender when he squeezes on it. He denies any chest pain or shortness of breath. No fevers, no chills, no dizziness or lightheadedness. The patient recently underwent bovine aortic valve [facility] at [facility]. Postop course was complicated by marantic endocarditis of the native valve and is currently on nafcillin as well as rifampin.   PAST MEDICAL HISTORY:  1. Aortic valve disease, severe aortic stenosis, moderate TR. 2. History of asthma as a child.  3. History of anxiety/depression.   PAST SURGICAL HISTORY:  1. Tonsillectomy.  2. Hemorrhoidectomy.  3. Aortic valve replacement 11/14/2011 with marantic endocarditis. The patient had [facility] by Dr. Noralee StainGrover.   CURRENT MEDICATIONS:  1. Nafcillin intravenous 2 grams q.4 hours. 2. Toprol-XL 25 mg once a day.  3. Rifampin capsule 300 mg 2 capsules by mouth every morning. 4. WelChol powder 3.75 grams daily.  5. Zoloft 50 mg a day.  6. Multivitamin 1 tablet a day. 7. Aspirin 81 mg a day.   ALLERGIES: Crestor, Vicodin, and ibuprofen.   FAMILY HISTORY: Positive for COPD in his dad.   PHYSICAL EXAMINATION:   VITAL SIGNS: Weight 181 pounds, blood pressure 138/78, pulse 78.   GENERAL: He was anxious.   HEENT: Normocephalic, atraumatic. Voice was hoarse.   NECK: No JVD.   HEART: S1, S2. 3/6 systolic ejection murmur.   LUNGS: Clear to auscultation.   ABDOMEN: Soft. Mild tenderness noted in the left mid and lower quadrant  area.   EXTREMITIES: Evidence of tenderness with swelling in the right calf.   NEUROLOGIC: The patient is alert and oriented. No obvious focal signs.   IMPRESSION:  1. Pain associated with tenderness in the right calf. Venous Doppler of the right leg showed evidence of DVT.  2. Recent aortic valve replacement with bovine valve, postop course complicated by marantic endocarditis with Staph aureus and is currently on nafcillin IV and rifampin p.o.  3. Depression/anxiety, on Zoloft.  4. Left lower quadrant abdominal pain. Will check stool for C. difficile. Consider Flagyl if positive.  5. Insomnia.  6. Hyperlipidemia.  7. Recent plantar fasciitis.   PLAN:  1. [facility].  2. Will start him on IV heparin and also start him on Coumadin 10 mg p.o. x1 and follow PT/INR.  3. The patient will be continued on his antibiotics, Nafcillin as well as rifampin and his other home medications including the Zoloft. 4. Will also check baseline labs including CBC, metabolic panel, factor V Leiden deficiency, PT/INR, hepatic panel, and Clostridium difficile toxin.   ____________________________ Barbette ReichmannVishwanath Lynsie Mcwatters, MD vh:drc D: 12/10/2011 17:14:30 ET T: 12/10/2011 17:26:44 ET JOB#: [id number]  cc: Barbette ReichmannVishwanath Ellianah Cordy, MD, <Dictator> Barbette ReichmannVISHWANATH Nevayah Faust MD ELECTRONICALLY SIGNED 12/11/2011 13:00

## 2015-02-18 NOTE — Discharge Summary (Signed)
PATIENT NAME:  Foy GuadalajaraVERETT, Usbaldo J MR#:  [id number] DATE OF BIRTH:  [birth year]  DATE OF ADMISSION:  12/10/2011 DATE OF DISCHARGE:  12/15/2011  DISCHARGE DIAGNOSES: 1. Deep vein thrombosis involving the right calf in the peroneal vein and also a partially occlusive thrombus in the duplicated superficial femoral vein and some within the popliteal vein.   2. History of recent bovine aortic valve replacement with postoperative course complicated by marantic endocarditis.  3. Depression, anxiety.  4. Left lower quadrant abdominal pain of unclear cause.  5. Recent plantar fasciitis.   CHIEF COMPLAINT: Pain associated with swelling of the right calf.   HISTORY OF PRESENT ILLNESS: Lillia CorporalRobert Peeters is a 64 year old male with [facility] of [facility] who [facility] complaining of pain in the right calf associated with swelling for about 4 to 5 days' duration. The patient denied any chest pain or shortness of breath. The patient had undergone a bovine aortic valve replacement and postoperative course was complicated by marantic endocarditis and he was currently on IV nafcillin and rifampin.   PAST MEDICAL HISTORY:  1. 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  2. History of asthma as child.  3. History of anxiety, depression.   PAST SURGICAL HISTORY:  1. Tonsillectomy.  2. Hemorrhoidectomy.  3. [facility] by Dr. Noralee StainGrover.   PHYSICAL EXAMINATION:  VITAL SIGNS: Weight 181 pounds, blood pressure 138/78, pulse 78. The patient was anxious. HEENT: Normocephalic, atraumatic. NECK: No jugular venous distention. HEART: S1, S2. 3/6 systolic ejection murmur. LUNGS: Clear to auscultation.  ABDOMEN: Soft. Mild tenderness noted in the left mid and lower quadrant area of the abdomen. No rebound. EXTREMITIES: Evidence of tenderness with swelling in the right  calf area.   HOSPITAL COURSE: The patient was [facility] and was started on IV heparin. He was also seen by GI in consultation because of the left lower quadrant abdominal pain. CT scan was unremarkable. Clostridium difficile toxin was negative.  The patient was started on empiric Flagyl therapy that appeared to help his symptoms. His INR remained subtherapeutic but he did receive IV heparin for a total of five days. He was also started on Coumadin day 1 at 10 mg a day and advised to take 12 mg of Coumadin on the day of discharge. The patient was ambulated.  Overall swelling of the right calf had also improved significantly, and he was relatively asymptomatic. He was discharged home on the following medications.   DISCHARGE MEDICATIONS:  1. Flagyl 250 mg p.o. t.i.d. for 10 days.  2. Coumadin 12 mg a day on the day of discharge and will have a PT/INR check on 12/16/2011 to determine further dose of Coumadin.  3. He was continued on his other medications: 4. Colesevelam 3.75 grams once a day.  5. Nafcillin 2 grams injectable every four hours [facility] as before.  6. Rifampin 600 mg b.i.d. 7. Sertraline 100 mg a day.  8. Metoprolol tartrate 25 mg once a day.  9. Multivitamin 1 tablet a day.  10. Aspirin 81 mg a day.   FOLLOWUP: The patient has been advised to follow with me, Dr. Marcello FennelHande, in 1 to 2 weeks. He was also advised to keep [facility] and with Dr. Romeo AppleHarrison in 1 to 2 weeks.       Dr. Mort SawyersHarrison's office was called and his  nurse was informed of the patient's admission and hospital course.   ____________________________ Barbette Reichmann, MD vh:bjt D: 12/16/2011 19:28:11 ET T: 12/17/2011 10:17:58 ET JOB#: [id number]  cc: Barbette Reichmann, MD, <Dictator> Barbette Reichmann MD ELECTRONICALLY SIGNED 12/19/2011 15:27

## 2015-02-18 NOTE — Discharge Summary (Signed)
[name] NAME:  Johnny [name], Johnny [name] MR#:  [id number] DATE OF BIRTH:  11/04/1950  DATE OF ADMISSION:  02/04/2012 DATE OF [name]:  02/06/2012  [name] DIAGNOSES:  1. Chest [name], most likely noncardiac etiology.  2. History of recent deep vein thrombosis.  3. History of aortic valve replacement.  4. Anxiety.  5. Sinus tachycardia.  6. Hyperlipidemia.   CHIEF COMPLAINT: Chest [name].   HISTORY OF PRESENT ILLNESS: Johnny [name] is a 64 year old male with a history of bovine [facility] on [facility]14[facility], who subsequently was hospitalized in February with right calf swelling and [name] secondary to deep venous thrombosis and had been on Coumadin for this but presented to Johnny [name] that had started about 2 to 3 days prior to admission. Johnny [name] had been evaluated in Johnny [name] and also underwent a CT of Johnny chest that was negative for PE but complained of severe left-sided chest [name] and left shoulder [name]. Johnny [name] denies any fevers, chills. No cough. No vomiting. No diarrhea.   PAST MEDICAL/SURGICAL HISTORY: Significant for: 1. Aortic valve disease with severe aortic stenosis, status [facility] complicated by marantic endocarditis.  2. History of asthma.  3. History of depression and anxiety.  4. History of hyperlipidemia.  5. Recent deep venous thrombosis.  6. History of hemorrhoid surgery.  7. Tonsillectomy.   PHYSICAL EXAMINATION: VITAL SIGNS: Temperature was 99.1, pulse was 116, respirations 18, blood pressure 161/96. GENERAL: Johnny [name] was not in distress and appeared anxious. NECK: No thyromegaly. No carotid bruits. HEENT: Normocephalic, atraumatic. HEART: S1, S2. LUNGS: Clear to auscultation. ABDOMEN: Soft, nontender. EXTREMITIES: Negative edema. NEUROLOGICAL: Nonfocal   [name] COURSE: Johnny [name] was seen in consultation by a cardiologist, Dr. Gwen PoundsKowalski, who felt that since Coumadin therapy was not adequately  resulting in sufficient anticoagulation, Johnny [name] could be switched to Xarelto. Johnny [name] also received heparin when Johnny [name] was Johnny [name]. Johnny [name] was ruled out for myocardial infarction and underwent an echocardiogram which showed ejection fraction of 55%. Left atrium was mildly dilated. There was mild mitral regurgitation. Right ventricular systolic pressure was elevated at 30 to 40 mmHg. During his stay in Johnny [name], Johnny [name] continued to improve. Johnny [name] was ambulated and overall felt better. Johnny [name] was started on metoprolol, that also helped with his tachycardia; and Johnny [name] was discharged in stable condition on Johnny [name].   [name] [name]:  1. Metoprolol 50 mg p.o. every 12 hours. 2. Xarelto 20 mg p.o. daily.  3. Welchol 3.75 grams daily.  4. Sertraline 100 mg a day.  5. Multivitamin 1 tablet. 6. Ambien 5 mg p.o. at bedtime p.r.n.   FOLLOWUP: Johnny [name] has been advised to follow-up with me, Dr. Marcello FennelHande, in 1 to 2 weeks' time. She was advised to call us if Johnny [name] has any questions or concerns.   CONDITION ON [name]: Johnny [name] is stable at Johnny [name].   ____________________________ Barbette ReichmannVishwanath Janya Eveland, MD vh:cbb D: 02/10/2012 12:33:55 ET T: 02/10/2012 18:33:29 ET JOB#: [id number]  cc: Barbette ReichmannVishwanath Caressa Scearce, MD, <Dictator> Barbette ReichmannVISHWANATH Trystian Crisanto MD ELECTRONICALLY SIGNED 02/16/2012 13:08

## 2015-02-18 NOTE — Consult Note (Signed)
Chief Complaint:   Subjective/Chief Complaint Denies diarrhea. Had a good bowel movement today. Mild LLQ pain. No bad enough to take anything for it. No other complaints.  Recommendations: Continue Flagyl for a total of one week. May use Bentyl 10 mg TID PRN PO if he desires something for abdominal pain. Will sign off. Thanks.   Electronic Signatures: Lurline DelIftikhar, Roarke Marciano (MD)  (Signed 17-Feb-13 11:31)  Authored: Chief Complaint   Last Updated: 17-Feb-13 11:31 by Lurline DelIftikhar, Jayden Kratochvil (MD)

## 2015-02-18 NOTE — Consult Note (Signed)
PATIENT NAME:  Johnny [name], Johnny [name] MR#:  [id number] DATE OF BIRTH:  April 16, 1951  DATE OF CONSULTATION:  02/04/2012  REFERRING PHYSICIAN:   CONSULTING PHYSICIAN:  Lamar Blinks, MD  PRIMARY CARE PHYSICIAN: Dr. Marcello Fennel  REASON FOR CONSULTATION: Tachycardia.   CHIEF COMPLAINT: "I had chest pain."   HISTORY OF PRESENT ILLNESS: This is a 64 year old male with known previous rheumatic heart disease with a recent endocarditis of his aortic valve status post aortic valve replacement and appropriate therapy. He then subsequently had a deep venous thrombosis for which he had no evidence of pulmonary embolism. He was placed on appropriate medications and his INR took a while to improve up to 2.0 level. The patient since had some shortness of breath, chest pain today with some tachycardia. EKG shows sinus tachycardia with nonspecific ST changes, but no evidence of myocardial infarction. Troponin, CK-MB are within normal limits at this time. The patient does have no evidence of other concerns. His hypertension has been well controlled. He has hyperlipidemia and minimal coronary artery disease by cardiac catheterization in January.   REVIEW OF SYSTEMS: Remainder review of systems is negative for vision change, ringing in the ears, hearing loss, cough, congestion, heartburn, nausea, vomiting, diarrhea, bloody stools, stomach pain, extremity pain, leg weakness, cramping of the buttocks, known blood clots, headaches, blackouts, dizzy spells, nosebleeds, congestion, trouble swallowing, frequent urination, urination at night, muscle weakness, numbness, anxiety, depression, skin lesions, skin rashes.   PAST MEDICAL HISTORY:  1. 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 2. Hypertension. 3. Hyperlipidemia.   FAMILY HISTORY: No family members with early onset of cardiovascular disease and hypertension.   SOCIAL HISTORY: He is currently denying alcohol and tobacco use.   ALLERGIES: He  has no known drug allergies.   CURRENT MEDICATIONS: As listed.   PHYSICAL EXAMINATION:  VITAL SIGNS: Blood pressure 126/68 bilaterally, heart rate 72 upright, reclining, and regular.   GENERAL: He is a well appearing male in no acute distress.   HEENT: No icterus, thyromegaly, ulcers, hemorrhage, or xanthelasma.   CARDIOVASCULAR: Regular rate and rhythm. Normal S1 and S2 with 2/6 apical murmur and a right upper sternal border murmur radiating throughout up into the carotids. Point of maximal impulse is normal size and placement. Carotid upstroke normal without bruit. Jugular venous pressure normal.   LUNGS: Lungs have few basilar crackles with normal respirations.   ABDOMEN: Soft, nontender, without hepatosplenomegaly or masses. Abdominal aorta is normal size without bruit.   EXTREMITIES: 2+ bilateral pulses in dorsal, pedal, radial, and femoral arteries without lower extremity edema, cyanosis, clubbing, ulcers.   NEUROLOGIC: He is oriented to time, place, and person with normal mood and affect.   ASSESSMENT: 64 year old male with tachycardia, chest discomfort, aortic valve endocarditis from rheumatic fever, hypertension, hyperlipidemia, without evidence of congestive heart failure or true angina or myocardial infarction with deep venous thrombosis needing further treatment options.   RECOMMENDATIONS:  1. Continue medical management of deep venous thrombosis with a goal INR between 2 to 3 and will use heparin if subtherapeutic at this time.  2. Echocardiogram for re-evaluation of aortic valve stenosis and/or endocarditis with further concerns of endocarditis only if patient has further symptoms of fever or other issues. 3. Continue hypertension, hyperlipidemia control without change of medications.  4. Consider CT scan and/or repeat lower extremity Doppler for deep venous thrombosis and/or pulmonary embolism causing tachycardia.  5. Ambulate, follow for other significant symptoms and  further diagnostic testing after above.   ____________________________  Lamar BlinksBruce J. Shereese Bonnie, MD bjk:cms D: 02/04/2012 19:08:48 ET T: 02/05/2012 10:45:21 ET JOB#: [id number]  cc: Lamar BlinksBruce J. Stella Encarnacion, MD, <Dictator> Lamar BlinksBRUCE J Mazi Brailsford MD ELECTRONICALLY SIGNED 02/05/2012 13:18

## 2015-02-18 NOTE — H&P (Signed)
PATIENT NAME:  Johnny [name], Johnny [name] MR#:  [id number] DATE OF BIRTH:  1951-04-18  DATE OF ADMISSION:  02/04/2012  ED REFERRING PHYSICIAN: Dr. Olivia Mackie   PRIMARY CARE PHYSICIAN: Dr. Barbette Reichmann    CHIEF COMPLAINT: Chest pain.   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 He was seen by his primary care physician and had a CT per PE protocol done two days ago which was negative. The patient had an INR checked yesterday and his INR was 2.2. The patient returns to the ED complaining of having severe sharp left-sided chest pain. The pain in his chest is radiating to his neck as well as his left shoulder. He reports that movement makes his pain worse. He also has associated shortness of breath. The patient in the ED was given pain medications; however, despite getting the pain medication his heart rate continues to be in the 120s. Therefore, I am asked to admit the patient. The patient otherwise denies any orthopnea or nocturnal dyspnea, denies any lower extremity swelling. He denies any fevers, chills, or any cough. No abdominal pain, nausea, vomiting, diarrhea, or urinary symptoms.   PAST MEDICAL/SURGICAL HISTORY: Significant for: 1. 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  2. History of asthma as a child.  3. History of depression and anxiety.  4. Hyperlipidemia.  5. Recent diagnosis of deep venous thrombosis.  6. Status post tonsillectomy.  7. Status post hemorrhoidectomy.  8. Status post  bovine valve replacement.   ALLERGIES: Crestor and Vicodin.   CURRENT MEDICATIONS:  1. Percocet 325/5, 1 tab p.o. b.i.d. as needed. 2. Ambien 5 mg at bedtime p.r.n.  3. Coumadin 10 mg x5 days except Saturday and Sunday when he takes 8 mg.  4. Cyclobenzaprine  5 mg 1 tab p.o. b.i.d.  5. Hydrochlorothiazide 12.5 mg daily.  6. Multivitamin 1 tab p.o. daily.  7. Sertraline 100 mg, 1 tab p.o. daily.  8. Tylenol Extra Strength 500 mg, 1 to 2 tabs daily as needed.  9. Welchol 3.75 grams daily, one packet in 8 ounces water.   SOCIAL HISTORY: Denies smoking, alcohol or drug use.   FAMILY HISTORY: Positive for chronic obstructive pulmonary disease in his father.   REVIEW OF SYSTEMS: CONSTITUTIONAL: Denies any fevers. Complains of fatigue, weakness. Complains of chest pain. No weight loss. No weight gain. EYES: No blurred or double vision. No pain, no redness or inflammation. No glaucoma. No cataracts. ENT: No tinnitus. No ear pain. No hearing loss. No allergies, seasonal or year-round. No nasal discharge. No difficulty swallowing. RESPIRATORY: No cough. No wheezing. No hemoptysis. No chronic obstructive pulmonary disease. No tuberculosis. CARDIOVASCULAR: Chest pain as above. No orthopnea. He denies any edema. No arrhythmia. No palpitations. GASTROINTESTINAL: No nausea, vomiting, diarrhea. No abdominal pain. No hematemesis. No melena. No ulcer. No gastroesophageal reflux disease. No irritable bowel syndrome. No changes in bowel habits. GENITOURINARY: Denies any dysuria, hematuria, renal calculus, or frequency. ENDOCRINE: Denies any polyuria, nocturia, or thyroid problems. No increase in sweating, heat or cold intolerance. HEME/LYMPH: No anemia, easy bruisability, or bleeding.  SKIN: No acne. No rash. No changes in mole, hair or skin. MUSCULOSKELETAL: Complains of pain in the neck and shoulder area. No gout. NEUROLOGIC: No numbness. No weakness. No cerebrovascular accident. No transient ischemic attack. No  seizures. PSYCHIATRIC: He does have a history of anxiety. No insomnia. No ADD. No OCD.   PHYSICAL EXAMINATION:  VITAL SIGNS: Temperature 99.1, pulse 116, respirations 18, blood pressure 161/96.   GENERAL: Currently, the patient is sleepy from his pain medications. He arrived in the ED in no acute distress.  HEENT: Head atraumatic, normocephalic. Pupils are equal, round, reactive to light and accommodation. Extraocular movements are intact. There is no conjunctival pallor. No scleral icterus. Nasal exam shows no ulceration or drainage. Ear exam shows no drainage or ulceration.   NECK: No thyromegaly. No carotid bruits.   CARDIOVASCULAR: Regular rate and rhythm, tachycardic. No murmurs, gallops. PMI is not displaced.   LUNGS: Clear to auscultation bilaterally without any rales, rhonchi, or wheezing.   ABDOMEN: Soft, nontender, nondistended. Positive bowel sounds x4.   EXTREMITIES: No clubbing, cyanosis, or edema.   SKIN: No rash.   LYMPHATICS: No lymph nodes palpable.   VASCULAR: Good DP, PT pulses.   PSYCHIATRIC: Not anxious or depressed.   NEUROLOGICAL: Cranial nerves II through XII are grossly intact. No focal deficits.  LABORATORY, DIAGNOSTIC AND RADIOLOGICAL DATA:  Evaluation in the Emergency Department  shows a chest x-ray which shows increased density at the left base compatible with atelectasis or pneumonia.  Troponin is less than 0.02. CPK was 50s. CK-MB is less than 0.5.  WBC 12.0, hemoglobin 12.2, platelet count 143, glucose 108, BUN 21, creatinine 1.06, sodium 138, potassium 4.0, chloride 102, CO2 27.  LFTs showed a slightly elevated AST.  INR is 1.3. CT scan of chest per PE protocol done on 02/02/2012: Interval development of small pericardial effusion. No CT evidence of pulmonary embolism.  EKG shows sinus tachycardia with nonspecific ST-T wave changes.   ASSESSMENT AND PLAN: The patient is a 64 year old white male with history of aortic valve replacement in January,  was in the hospital in February for deep vein thrombosis, has been on Coumadin since, has been having left-sided chest pain, left arm pain since Saturday. CT scan of the chest per PE has been recently negative.  1. Chest pain: Unlikely related to coronary artery disease, since he had a catheterization in January 2013 which was completely negative. He also had a CT scan done two days ago which was negative. He has no reproducible nature to the pain, unlikely musculoskeletal. Due to his recent aortic surgery, we do need to rule out any pericardial cause for his symptoms although his EKG shows no evidence of pericarditis. We will go ahead and get an echocardiogram of his heart. We will check serial cardiac enzymes, ask Cardiology to evaluate for any further recommendations.  2. Sinus tachycardia: Possibly due to pain. He was on beta blocker previously which has been stopped. We will restart metoprolol.  3. Recent diagnosis of deep vein thrombosis: INR yesterday was 2.2, today is 1.3 here. I will place him on full-dose Lovenox until INR is therapeutic.  4. Abnormal chest x-ray: Likely from atelectasis. The patient has no cough or no other symptoms to suggest pneumonia. It is likely due to his shallow breathing from the pain that he is experiencing.  5. Leukocytosis of unclear etiology: We will repeat a CBC in the morning. Follow for any symptoms of infection. Hold off on any antibiotics.  6. Anxiety/depression: Continue  sertraline.  7. Hyperlipidemia: Continue Welchol as taking at home.  8. Prophylaxis: The patient will be on Coumadin and Lovenox for deep vein thrombosis prophylaxis.   TIME SPENT:   35 minutes.   ____________________________ Lacie ScottsShreyang H. Allena KatzPatel, MD shp:cbb D: 02/04/2012 17:20:52 ET T: 02/04/2012 17:43:41 ET JOB#: [id number]  cc: Ailis Rigaud H. Allena KatzPatel, MD, <Dictator> Barbette ReichmannVishwanath Hande, MD Charise CarwinSHREYANG H Lijah Bourque MD ELECTRONICALLY SIGNED 02/07/2012 16:34

## 2015-02-18 NOTE — Consult Note (Signed)
PATIENT NAME:  Johnny [name], Johnny [name] MR#:  [id number] DATE OF BIRTH:  03/26/1951  DATE OF CONSULTATION:  12/13/2011  REFERRING PHYSICIAN:  Dr. Marcello FennelHande CONSULTING PHYSICIAN:  Lurline DelShaukat Camil Hausmann, MD  REASON FOR CONSULTATION: Left lower quadrant abdominal pain and diarrhea.   HISTORY OF PRESENT ILLNESS: This is a 64 year old male with history of recent replacement of bovine aortic valve about 3 to 4 weeks ago at [facility]. The patient was found to have endocarditis at that point and was started on IV antibiotics at home. The patient was recently admitted to the hospital after he presented with right calf pain. A Doppler ultrasound showed deep vein thrombosis. He was started on heparin as well as Coumadin. In the hospital, the patient started to complain of left lower quadrant abdominal pain and started to have some diarrhea as of yesterday. C. difficile toxin was checked in the stool, which was negative. I was called by Dr. Marcello FennelHande  yesterday to evaluate the patient for this left lower quadrant abdominal pain and diarrhea. The patient was seen yesterday afternoon. He was very frustrated with this new development of abdominal pain and diarrhea. The pain is almost constant in the left lower quadrant area although often  relieved by rest and exacerbated by movements. The diarrhea has just started with loose brown bowel movements. Denies any rectal bleeding. No other significant symptoms were reported by the patient.   PAST MEDICAL HISTORY:  1. Aortic stenosis, recent aortic valve replacement. 2. Asthma.  3. Anxiety and depression.   PAST SURGICAL HISTORY:  1. Tonsillectomy.  2. Hemorrhoidectomy.  3. Aortic valve replacement with bovine valve on 01/18.   MEDICATIONS:  1. Nafcillin. 2. Toprol-XL.  3. Rifampin. 4. WelChol. 5. Zoloft. 6. Multivitamin. 7. Aspirin.   ALLERGIES: Crestor, Vicodin, and ibuprofen.   FAMILY HISTORY:  Positive for chronic obstructive pulmonary disease.   PHYSICAL EXAMINATION:   GENERAL: Well-built male who does not appear to be in any acute distress, does not appear to be toxic or septic.   VITAL SIGNS: He is afebrile, somewhat tachycardic with a heart rate of around 100, blood pressure 135/80.   HEENT: Unremarkable. No anemia or jaundice was noted.   NECK: Veins are flat.   LUNGS: Grossly clear to auscultation bilaterally with fair with fair air entry and no added sounds.   CARDIOVASCULAR: Regular rate and rhythm.   ABDOMEN: Soft and benign. Some tenderness was noted in the left lower quadrant area without any rebound or guarding. No hepatosplenomegaly or ascites.    NEUROLOGIC: Unremarkable. He is awake, alert, and oriented. Neurological examination is nonfocal.   LABS/STUDIES: INR is 1.4. He is on Coumadin. White cell count is 4.6, hemoglobin 11.1, hematocrit 32.1, and platelet count 120. Lipase 181, total bilirubin is 1.4. The rest of the liver enzymes are normal. Electrolytes, BUN, and creatinine are fairly unremarkable as well. Stool for C. difficile toxin is negative. CT scan of the abdomen and pelvis done yesterday did not show any acute intraabdominal pathology.   ASSESSMENT AND PLAN: The patient is with recent aortic valve replacement. The patient has been on antibiotics for the last couple of weeks. The patient now is presenting with left lower quadrant pain and diarrhea. Initially this raised concerns about possible Clostridium difficile colitis. Stool for C. difficile toxin is negative. CT is negative for colitis. Empiric treatment with Flagyl has been recommended, which was started yesterday. As of today the patient has significant improvement in his abdominal pain as well as diarrhea. We will continue  with empiric Flagyl 250 mg 3 times a day for about a week. No other significant intraabdominal pathologies have been noticed.  We will continue to follow.     ____________________________ Lurline Del, MD si:bjt D: 12/13/2011 11:22:47  ET T: 12/13/2011 12:07:08 ET JOB#: [id number]  cc: Lurline Del, MD, <Dictator> Lurline Del MD ELECTRONICALLY SIGNED 12/16/2011 12:05

## 2015-12-29 ENCOUNTER — Ambulatory Visit
Admission: EM | Admit: 2015-12-29 | Discharge: 2015-12-29 | Disposition: A | Discharge status: Home / Self Care | Payer: BC Managed Care – PPO | Attending: Family Medicine | Admitting: Family Medicine

## 2015-12-29 DIAGNOSIS — Z20818 Contact with and (suspected) exposure to other bacterial communicable diseases: Secondary | ICD-10-CM

## 2015-12-29 DIAGNOSIS — Z2089 Contact with and (suspected) exposure to other communicable diseases: Secondary | ICD-10-CM

## 2015-12-29 DIAGNOSIS — J069 Acute upper respiratory infection, unspecified: Secondary | ICD-10-CM | POA: Diagnosis not present

## 2015-12-29 HISTORY — DX: Diplopia: H53.2

## 2015-12-29 HISTORY — DX: Bilateral primary osteoarthritis of knee: M17.0

## 2015-12-29 HISTORY — DX: Localization-related (focal) (partial) symptomatic epilepsy and epileptic syndromes with complex partial seizures, not intractable, without status epilepticus: G40.209

## 2015-12-29 HISTORY — DX: Nonrheumatic mitral (valve) insufficiency: I34.0

## 2015-12-29 HISTORY — DX: Hyperlipidemia, unspecified: E78.5

## 2015-12-29 HISTORY — DX: Major depressive disorder, single episode, unspecified: F32.9

## 2015-12-29 HISTORY — DX: Essential (primary) hypertension: I10

## 2015-12-29 HISTORY — DX: Unspecified osteoarthritis, unspecified site: M19.90

## 2015-12-29 HISTORY — DX: Depression, unspecified: F32.A

## 2015-12-29 HISTORY — DX: Cerebral infarction, unspecified: I63.9

## 2015-12-29 HISTORY — DX: Endocarditis, valve unspecified: I38

## 2015-12-29 LAB — RAPID INFLUENZA A&B ANTIGENS (ARMC ONLY): INFLUENZA B (ARMC): NEGATIVE

## 2015-12-29 LAB — RAPID INFLUENZA A&B ANTIGENS: Influenza A (ARMC): NEGATIVE

## 2015-12-29 LAB — RAPID STREP SCREEN (MED CTR MEBANE ONLY): Streptococcus, Group A Screen (Direct): NEGATIVE

## 2015-12-29 MED ORDER — AZITHROMYCIN 250 MG PO TABS: ORAL_TABLET | ORAL | Status: DC

## 2015-12-29 NOTE — ED Provider Notes (Signed)
CSN: [id number]     Arrival date & time 12/29/15  1531 History   First MD Initiated Contact with Patient 12/29/15 1551     Chief Complaint  Patient presents with  . URI   (Consider location/radiation/quality/duration/timing/severity/associated sxs/prior Treatment) HPI   65 year old gentleman who presents with a one-week history of coughing throat pain nasal congestion. [facility] and guaifenesin codeine cough syrup and prednisone which he finished yesterday. Also given a nasal spray. He has not felt any better since that visit. His wife was seen today thinking she may have Flu but was positive on the rapid strep and given an injection of LA Bicillin. Is concerned that he may have strep throat as well.  Past Medical History  Diagnosis Date  . Osteoarthritis of both knees   . Hypertension   . Complex partial seizures (HCC)   . Cerebellar stroke (HCC)   . Hyperlipidemia   . Arthritis   . Depression   . Mitral valve regurgitation   . Endocarditis   . Diplopia    Past Surgical History  Procedure Laterality Date  . Aortic valve replacement  11/04/2011  . Lens eye surgery   06/2013  . Removal secondary membranous cataract   09/27/2013  . Repair retinal detachment w/scleral buckle  12/22/2012  . Cardiac valve replacement  2013  . Tonsillectomy    . Hemorrhoid surgery      SIMPLE LIGATION   . Colonoscopy     Family History  Problem Relation Age of Onset  . Emphysema Father    Social History  Substance Use Topics  . Smoking status: Never Smoker   . Smokeless tobacco: None  . Alcohol Use: No    Review of Systems  Constitutional: Positive for chills, activity change and fatigue. Negative for fever.  HENT: Positive for congestion, postnasal drip, sinus pressure, sneezing and sore throat.   Respiratory: Positive for cough. Negative for shortness of breath, wheezing and stridor.   All other systems reviewed and are negative.   Allergies  Hydrocodone-acetaminophen;  Ibuprofen; and Rosuvastatin  Home Medications   Prior to Admission medications   Medication Sig Start Date End Date Taking? Authorizing Provider  acetaminophen (TYLENOL) 325 MG tablet Take 650 mg by mouth every 6 (six) hours as needed.   Yes Historical Provider, MD  amoxicillin (AMOXIL) 500 MG capsule Take 500 mg by mouth 3 (three) times daily.   Yes Historical Provider, MD  aspirin 81 MG tablet Take 81 mg by mouth daily.   Yes Historical Provider, MD  atorvastatin (LIPITOR) 10 MG tablet Take 10 mg by mouth daily.   Yes Historical Provider, MD  calcium citrate-vitamin D (CITRACAL+D) 315-200 MG-UNIT tablet Take 1 tablet by mouth 2 (two) times daily.   Yes Historical Provider, MD  diphenhydrAMINE (BENADRYL) 25 mg capsule Take 25 mg by mouth every 6 (six) hours as needed.   Yes Historical Provider, MD  guaiFENesin-codeine (ROBITUSSIN AC) 100-10 MG/5ML syrup Take 5 mLs by mouth 3 (three) times daily as needed for cough.   Yes Historical Provider, MD  lisinopril (PRINIVIL,ZESTRIL) 10 MG tablet Take 10 mg by mouth daily.   Yes Historical Provider, MD  Multiple Vitamin (MULTIVITAMIN) tablet Take 1 tablet by mouth daily.   Yes Historical Provider, MD  omega-3 acid ethyl esters (LOVAZA) 1 g capsule Take by mouth 2 (two) times daily.   Yes Historical Provider, MD  pyridoxine (B-6) 100 MG tablet Take 100 mg by mouth daily.   Yes Historical Provider,  MD  azithromycin (ZITHROMAX Z-PAK) 250 MG tablet Use as per package instructions 12/29/15   Lutricia Feil, PA-C  sildenafil (VIAGRA) 100 MG tablet Take 100 mg by mouth daily as needed for erectile dysfunction.    Historical Provider, MD   Meds Ordered and Administered this Visit  Medications - No data to display  BP 129/74 mmHg  Pulse 88  Temp(Src) 97.3 F (36.3 C) (Oral)  Resp 16  Ht  (1.727 m)  Wt 175 lb (79.379 kg)  BMI 26.61 kg/m2  SpO2 98% No data found.   Physical Exam  Constitutional: He is oriented to person, place, and time. He  appears well-developed and well-nourished. No distress.  HENT:  Head: Normocephalic and atraumatic.  Right Ear: External ear normal.  Left Ear: External ear normal.  Nose: Nose normal.  Mouth/Throat: Oropharynx is clear and moist. No oropharyngeal exudate.  Eyes: Conjunctivae are normal. Pupils are equal, round, and reactive to light.  Neck: Normal range of motion. Neck supple.  Pulmonary/Chest: Effort normal and breath sounds normal. No respiratory distress. He has no wheezes. He has no rales.  Musculoskeletal: Normal range of motion. He exhibits no edema or tenderness.  Lymphadenopathy:    He has no cervical adenopathy.  Neurological: He is alert and oriented to person, place, and time.  Skin: Skin is warm and dry. He is not diaphoretic.  Psychiatric: He has a normal mood and affect. His behavior is normal. Judgment and thought content normal.  Nursing note and vitals reviewed.   ED Course  Procedures (including critical care time)  Labs Review Labs Reviewed  RAPID INFLUENZA A&B ANTIGENS ([facility] ONLY)  RAPID STREP SCREEN (NOT AT [facility])  CULTURE, GROUP A STREP [facility])    Imaging Review No results found.   Visual Acuity Review  Right Eye Distance:   Left Eye Distance:   Bilateral Distance:    Right Eye Near:   Left Eye Near:    Bilateral Near:         MDM   1. Acute URI   2. Strep throat exposure    Discharge Medication List as of 12/29/2015  4:29 PM    START taking these medications   Details  azithromycin (ZITHROMAX Z-PAK) 250 MG tablet Use as per package instructions, Print      Plan: 1. Test/x-ray results and diagnosis reviewed with patient 2. rx as per orders; risks, benefits, potential side effects reviewed with patient 3. Recommend supportive treatment with fluids and rest. Since his wife did test positive for strep I will cover him but use Z-Pak cause of his chest symptoms. He should call in 48 hours for the results of the throat C&S. He should  follow-up with his primary care physician if there is any worsening or is not improving 4. F/u prn if symptoms worsen or don't improve     Lutricia Feil, PA-C 12/29/15 1653

## 2015-12-29 NOTE — ED Notes (Signed)
Patient c/o coughing / throat pain / nasal congestion x 1 week. Pt. Stated was [facility] and was given Guaifenesin- codeine x 7 days for his cough and Prednisone which he finish yesterday. Per pt was also given nasal spray. Per pt. Wife was seen this am and dx with strep.

## 2015-12-29 NOTE — Discharge Instructions (Signed)
Airborne Precautions Airborne precautions are guidelines for the care of a person who has a disease that spreads through the air (airborne disease). Following these guidelines helps to prevent the disease from spreading to others. GUIDELINES FOR PATIENTS If you have an airborne disease, follow these guidelines during your stay:  You will be treated in a private room with a special air supply. Keep the door to the private room closed. Check with your nurse before you leave the room.  Wear a mask if you go to another area of the hospital or clinic. Make sure that the mask fits tightly.  Cover your mouth with a tissue when you cough.  Cover your nose with a tissue when you sneeze.  Wash your hands often. GUIDELINES FOR VISITORS If you are visiting a person who has an airborne disease, follow these guidelines:  Check with a nurse before you enter a room that has a sign that says "Airborne Precautions."  If you are allowed to enter the room, you will be asked to wash your hands and wear a mask over your nose and mouth. Make sure that the mask fits tightly.  Do not take off your mask in the room.  Do not eat or drink in the room.  Do not use or touch any items in the room unless you ask a nurse first.  Right after you leave the room, take off your mask and throw it in the trash. Then wash your hands.   This information is not intended to replace advice given to you by your health care provider. Make sure you discuss any questions you have with your health care provider.   Document Released: 02/27/2015 Document Reviewed: 02/27/2015 Elsevier Interactive Patient Education 2016 [employer].  Contact Precautions Contact precautions are guidelines for the care of a person who has a disease that can spread by skin-to-skin contact or by contact with bodily fluids, such as saliva, stool, and fluid from open wounds. Following these guidelines helps to prevent the disease from spreading to  others. GUIDELINES FOR PATIENTS If you have a disease that can spread by skin-to-skin contact or by contact with bodily fluids, follow these guidelines during your stay:  Check with your nurse before you leave the room in which you are being treated.  Wear a gown and a bandage (dressing) over the infected area if you need to leave the room in which you are being treated.  Wash your hands often. GUIDELINES FOR VISITORS If you are visiting a person who has a disease that can spread by skin-to-skin contact or by contact with bodily fluids, follow these guidelines:  Check with a nurse before you enter a room that has a sign that says "Contact Precautions."  If you are allowed to enter the room, you will be asked to wash your hands and wear a gown and gloves.  Do not take off your gown or gloves in the room until you are leaving.  Do not eat or drink in the room unless you ask a nurse first.  Do not touch anything in the room unless you ask a nurse first.  Right before you leave the room, take off your gown and then your gloves. Leave them in the room as directed. Then wash your hands.   This information is not intended to replace advice given to you by your health care provider. Make sure you discuss any questions you have with your health care provider.   Document Released: 02/27/2015 Document Reviewed:  02/27/2015 Elsevier Interactive Patient Education 2016 [employer]  [employer] Mist Vaporizers Vaporizers may help relieve the symptoms of a cough and cold. They add moisture to the air, which helps mucus to become thinner and less sticky. This makes it easier to breathe and cough up secretions. Cool mist vaporizers do not cause serious burns like hot mist vaporizers, which may also be called steamers or humidifiers. Vaporizers have not been proven to help with colds. You should not use a vaporizer if you are allergic to mold. HOME CARE INSTRUCTIONS  Follow the package instructions for the  vaporizer.  Do not use anything other than distilled water in the vaporizer.  Do not run the vaporizer all of the time. This can cause mold or bacteria to grow in the vaporizer.  Clean the vaporizer after each time it is used.  Clean and dry the vaporizer well before storing it.  Stop using the vaporizer if worsening respiratory symptoms develop.   This information is not intended to replace advice given to you by your health care provider. Make sure you discuss any questions you have with your health care provider.   Document Released: 07/10/2004 Document Revised: 10/18/2013 Document Reviewed: 03/02/2013 Elsevier Interactive Patient Education [employer]2016 Elsevier Inc.

## 2015-12-31 LAB — CULTURE, GROUP A STREP (THRC)

## 2016-03-21 DIAGNOSIS — N183 Chronic kidney disease, stage 3 unspecified: Secondary | ICD-10-CM | POA: Insufficient documentation

## 2016-09-14 ENCOUNTER — Ambulatory Visit
Admission: EM | Admit: 2016-09-14 | Discharge: 2016-09-14 | Disposition: A | Discharge status: Home / Self Care | Payer: Medicare Other | Attending: Family Medicine | Admitting: Family Medicine

## 2016-09-14 DIAGNOSIS — R109 Unspecified abdominal pain: Secondary | ICD-10-CM

## 2016-09-14 HISTORY — DX: Unspecified kidney failure: N19

## 2016-09-14 MED ORDER — DICYCLOMINE HCL 20 MG PO TABS: 20.0000 mg | ORAL_TABLET | Freq: Two times a day (BID) | ORAL | 0 refills | Status: DC | PRN

## 2016-09-14 NOTE — Discharge Instructions (Signed)
Recommend continue eating yogurt. May take Tylenol as needed for pain. May try Bentyl 1 tablet twice a day as needed for abdominal pain. Follow-up with your primary care provider if symptoms are not improving or go to ER if symptoms worsen.

## 2016-09-14 NOTE — ED Triage Notes (Signed)
Pt with LLQ abd pain, feels like "someone is pressing a finger into my stomach." Pain 3/10. Denies n/v/d. Denies urinary sx. Eating and drinking normally.

## 2016-09-14 NOTE — ED Provider Notes (Signed)
CSN: [id number]     Arrival date & time 09/14/16  0804 History   First MD Initiated Contact with Patient 09/14/16 [phone]     Chief Complaint  Patient presents with  . Abdominal Pain   (Consider location/radiation/quality/duration/timing/severity/associated sxs/prior Treatment) 65 year old male presents with left mid to lower abdominal pain that started 3 days ago. Felt like someone was pushing into his abdomen. Took Amoxicillin (for 1 day before dental procedures) 2 days prior to the pain starting and uncertain if antibiotic was causing intestinal irritation. Has tried antacid with minimal relief. Took Tylenol PM last night which helped with sleep. Also noticed he had yogurt today and symptoms have improved. Denies any nausea, vomiting, diarrhea or constipation. Has regular bowel movements and last BM was yesterday morning.    The history is provided by the patient and the spouse.    Past Medical History:  Diagnosis Date  . Arthritis   . Cerebellar stroke (HCC)   . Complex partial seizures (HCC)   . Depression   . Diplopia   . Endocarditis   . Hyperlipidemia   . Hypertension   . Kidney failure   . Mitral valve regurgitation   . Osteoarthritis of both knees    Past Surgical History:  Procedure Laterality Date  . 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 CARDIAC VALVE REPLACEMENT  2013  . COLONOSCOPY    . HEMORRHOID SURGERY     SIMPLE LIGATION   . lens eye surgery   06/2013  . REMOVAL SECONDARY MEMBRANOUS CATARACT   09/27/2013  . REPAIR RETINAL DETACHMENT W/SCLERAL BUCKLE  12/22/2012  . TONSILLECTOMY     Family History  Problem Relation Age of Onset  . Emphysema Father    Social History  Substance Use Topics  . Smoking status: Never Smoker  . Smokeless tobacco: Never Used  . Alcohol use No    Review of Systems  Constitutional: Negative for appetite change, chills, fatigue and fever.  Respiratory: Negative for cough, chest tightness, shortness of breath and wheezing.    Cardiovascular: Negative for chest pain.  Gastrointestinal: Positive for abdominal pain. Negative for blood in stool, constipation, diarrhea, nausea and vomiting.  Genitourinary: Negative for difficulty urinating and flank pain.  Musculoskeletal: Negative for back pain.  Skin: Negative for rash.  Neurological: Negative for dizziness, weakness, light-headedness and headaches.  Hematological: Negative for adenopathy.    Allergies  Hydrocodone-acetaminophen; Ibuprofen; and Rosuvastatin  Home Medications   Prior to Admission medications   Medication Sig Start Date End Date Taking? Authorizing Provider  Bromfenac Sodium (PROLENSA) 0.07 % SOLN Apply to eye.   Yes Historical Provider, MD  lamoTRIgine (LAMICTAL) 150 MG tablet Take 150 mg by mouth daily.   Yes Historical Provider, MD  acetaminophen (TYLENOL) 325 MG tablet Take 650 mg by mouth every 6 (six) hours as needed.    Historical Provider, MD  aspirin 81 MG tablet Take 81 mg by mouth daily.    Historical Provider, MD  atorvastatin (LIPITOR) 10 MG tablet Take 10 mg by mouth daily.    Historical Provider, MD  dicyclomine (BENTYL) 20 MG tablet Take 1 tablet (20 mg total) by mouth 2 (two) times daily as needed for spasms (and stomach pain). 09/14/16   Sudie GrumblingAnn Berry Tywone Bembenek, NP  diphenhydrAMINE (BENADRYL) 25 mg capsule Take 25 mg by mouth every 6 (six) hours as needed.    Historical Provider, MD  lisinopril (PRINIVIL,ZESTRIL) 10 MG tablet Take 10 mg by mouth daily.    Historical Provider,  MD  Multiple Vitamin (MULTIVITAMIN) tablet Take 1 tablet by mouth daily.    Historical Provider, MD  omega-3 acid ethyl esters (LOVAZA) 1 g capsule Take by mouth 2 (two) times daily.    Historical Provider, MD  pyridoxine (B-6) 100 MG tablet Take 100 mg by mouth daily.    Historical Provider, MD   Meds Ordered and Administered this Visit  Medications - No data to display  BP (!) 150/63 (BP Location: Left Arm)   Pulse 72   Temp 98 F (36.7 C) (Oral)   Resp 16    Ht 5\' 8"  (1.727 m)   Wt 172 lb 8 oz (78.2 kg)   SpO2 98%   BMI 26.23 kg/m  No data found.   Physical Exam  Constitutional: He is oriented to person, place, and time. He appears well-developed and well-nourished. No distress.  HENT:  Head: Normocephalic and atraumatic.  Nose: Nose normal.  Mouth/Throat: Oropharynx is clear and moist.  Cardiovascular: Normal rate.  An irregular rhythm present.  Murmur heard. Pulmonary/Chest: Effort normal and breath sounds normal. No respiratory distress. He has no wheezes. He has no rales.  Abdominal: Soft. Bowel sounds are normal. He exhibits no distension and no mass. There is no hepatosplenomegaly. There is no tenderness. There is no rigidity, no rebound, no guarding and no CVA tenderness. No hernia.  No tenderness to palpation. No pain with movement.   Musculoskeletal: Normal range of motion.  Neurological: He is alert and oriented to person, place, and time.  Skin: Skin is warm and dry.  Psychiatric: He has a normal mood and affect. His behavior is normal. Judgment and thought content normal.    Urgent Care Course   Clinical Course     Procedures (including critical care time)  Labs Review Labs Reviewed - No data to display  Imaging Review No results found.   Visual Acuity Review  Right Eye Distance:   Left Eye Distance:   Bilateral Distance:    Right Eye Near:   Left Eye Near:    Bilateral Near:         MDM   1. Left sided abdominal pain    Discussed various possible causes of pain- uncertain of etiology but may be due from irritation from Antibiotic or recent food. Discussed in length (15 minutes) various probiotics and food choices. Recommend continue yogurt since it seems to help with symptoms. May take Bentyl 20mg  twice a day as needed for pain. Recommend follow-up with his primary care provider in 2 days if not improving or go to ER if pain worsens for further evaluation.     Sudie GrumblingAnn Berry Artist Bloom, NP 09/14/16  [phone]

## 2016-09-15 ENCOUNTER — Emergency Department
Admission: EM | Admit: 2016-09-15 | Discharge: 2016-09-15 | Disposition: A | Discharge status: Home / Self Care | Payer: Medicare Other | Attending: Emergency Medicine | Admitting: Emergency Medicine

## 2016-09-15 ENCOUNTER — Encounter: Payer: Self-pay | Admitting: Emergency Medicine

## 2016-09-15 ENCOUNTER — Emergency Department: Payer: Medicare Other

## 2016-09-15 DIAGNOSIS — R1032 Left lower quadrant pain: Secondary | ICD-10-CM | POA: Insufficient documentation

## 2016-09-15 DIAGNOSIS — Z952 Presence of prosthetic heart valve: Secondary | ICD-10-CM | POA: Diagnosis not present

## 2016-09-15 DIAGNOSIS — Z7982 Long term (current) use of aspirin: Secondary | ICD-10-CM | POA: Diagnosis not present

## 2016-09-15 DIAGNOSIS — Z79899 Other long term (current) drug therapy: Secondary | ICD-10-CM | POA: Insufficient documentation

## 2016-09-15 DIAGNOSIS — I10 Essential (primary) hypertension: Secondary | ICD-10-CM | POA: Insufficient documentation

## 2016-09-15 HISTORY — DX: Disorder of kidney and ureter, unspecified: N28.9

## 2016-09-15 LAB — URINALYSIS COMPLETE WITH MICROSCOPIC (ARMC ONLY)
BACTERIA UA: NONE SEEN
BILIRUBIN URINE: NEGATIVE
Glucose, UA: NEGATIVE mg/dL
HGB URINE DIPSTICK: NEGATIVE
Ketones, ur: NEGATIVE mg/dL
LEUKOCYTES UA: NEGATIVE
NITRITE: NEGATIVE
PH: 7 (ref 5.0–8.0)
PROTEIN: NEGATIVE mg/dL
RBC / HPF: NONE SEEN RBC/hpf (ref 0–5)
Specific Gravity, Urine: 1.012 (ref 1.005–1.030)
Squamous Epithelial / LPF: NONE SEEN

## 2016-09-15 LAB — COMPREHENSIVE METABOLIC PANEL
ALBUMIN: 4.6 g/dL (ref 3.5–5.0)
ALK PHOS: 63 U/L (ref 38–126)
ALT: 23 U/L (ref 17–63)
ANION GAP: 9 (ref 5–15)
AST: 29 U/L (ref 15–41)
BUN: 24 mg/dL — ABNORMAL HIGH (ref 6–20)
CALCIUM: 9.7 mg/dL (ref 8.9–10.3)
CHLORIDE: 105 mmol/L (ref 101–111)
CO2: 25 mmol/L (ref 22–32)
Creatinine, Ser: 1.27 mg/dL — ABNORMAL HIGH (ref 0.61–1.24)
GFR calc Af Amer: >60 mL/min (ref >60)
GFR calc non Af Amer: 58 mL/min — ABNORMAL LOW (ref >60)
GLUCOSE: 107 mg/dL — AB (ref 65–99)
POTASSIUM: 4.1 mmol/L (ref 3.5–5.1)
SODIUM: 139 mmol/L (ref 135–145)
Total Bilirubin: 1.4 mg/dL — ABNORMAL HIGH (ref 0.3–1.2)
Total Protein: 7.7 g/dL (ref 6.5–8.1)

## 2016-09-15 LAB — CBC
HCT: 40.3 % (ref 40.0–52.0)
HEMOGLOBIN: 14.3 g/dL (ref 13.0–18.0)
MCH: 32 pg (ref 26.0–34.0)
MCHC: 35.4 g/dL (ref 32.0–36.0)
MCV: 90.2 fL (ref 80.0–100.0)
Platelets: 107 10*3/uL — ABNORMAL LOW (ref 150–440)
RBC: 4.46 MIL/uL (ref 4.40–5.90)
RDW: 12.7 % (ref 11.5–14.5)
WBC: 9.2 10*3/uL (ref 3.8–10.6)

## 2016-09-15 LAB — LIPASE, BLOOD: Lipase: 22 U/L (ref 11–51)

## 2016-09-15 MED ORDER — ONDANSETRON HCL 4 MG/2ML IJ SOLN
4.0000 mg | Freq: Once | INTRAMUSCULAR | Status: AC
  Administered 2016-09-15: 4 mg via INTRAVENOUS
  Filled 2016-09-15: qty 2

## 2016-09-15 MED ORDER — IOPAMIDOL (ISOVUE-300) INJECTION 61%
100.0000 mL | Freq: Once | INTRAVENOUS | Status: AC | PRN
  Administered 2016-09-15: 100 mL via INTRAVENOUS

## 2016-09-15 MED ORDER — MORPHINE SULFATE (PF) 4 MG/ML IV SOLN
4.0000 mg | Freq: Once | INTRAVENOUS | Status: AC
  Administered 2016-09-15: 4 mg via INTRAVENOUS
  Filled 2016-09-15 (×2): qty 1

## 2016-09-15 MED ORDER — IOPAMIDOL (ISOVUE-300) INJECTION 61%
30.0000 mL | Freq: Once | INTRAVENOUS | Status: AC | PRN
  Administered 2016-09-15: 30 mL via ORAL

## 2016-09-15 NOTE — Discharge Instructions (Signed)
Please follow up with urology as we discussed given the inflammation surrounding your lower kidney on the left.

## 2016-09-15 NOTE — ED Provider Notes (Signed)
[facility] Emergency Department Provider Note   ____________________________________________    I have reviewed the triage vital signs and the nursing notes.   HISTORY  Chief Complaint Abdominal Pain     HPI Johnny [name] is a 65 y.o. male who presents with complaints of abdominal pain. Patient reports 3 days of worsening left lower quadrant pain. He was seen in urgent care and given dicyclomine but denies improvement. He denies nausea or vomiting. No fevers. He reports the pain is cramping and sharp in nature and is constant. He has not taken anything else for it. He has never had this before. No history of abdominal surgeries.   Past Medical History:  Diagnosis Date  . Arthritis   . Cerebellar stroke (HCC)   . Complex partial seizures (HCC)   . Depression   . Diplopia   . Endocarditis   . Hyperlipidemia   . Hypertension   . Kidney failure   . Mitral valve regurgitation   . Osteoarthritis of both knees     There are no active problems to display for this patient.   Past Surgical History:  Procedure Laterality Date  . 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 CARDIAC VALVE REPLACEMENT  2013  . COLONOSCOPY    . HEMORRHOID SURGERY     SIMPLE LIGATION   . lens eye surgery   06/2013  . REMOVAL SECONDARY MEMBRANOUS CATARACT   09/27/2013  . REPAIR RETINAL DETACHMENT W/SCLERAL BUCKLE  12/22/2012  . TONSILLECTOMY      Prior to Admission medications   Medication Sig Start Date End Date Taking? Authorizing Provider  acetaminophen (TYLENOL) 325 MG tablet Take 650 mg by mouth every 6 (six) hours as needed.    Historical Provider, MD  aspirin 81 MG tablet Take 81 mg by mouth daily.    Historical Provider, MD  atorvastatin (LIPITOR) 10 MG tablet Take 10 mg by mouth daily.    Historical Provider, MD  Bromfenac Sodium (PROLENSA) 0.07 % SOLN Apply to eye.    Historical Provider, MD  dicyclomine (BENTYL) 20 MG tablet Take 1 tablet (20 mg total) by  mouth 2 (two) times daily as needed for spasms (and stomach pain). 09/14/16   Sudie GrumblingAnn Berry Amyot, NP  diphenhydrAMINE (BENADRYL) 25 mg capsule Take 25 mg by mouth every 6 (six) hours as needed.    Historical Provider, MD  lamoTRIgine (LAMICTAL) 150 MG tablet Take 150 mg by mouth daily.    Historical Provider, MD  lisinopril (PRINIVIL,ZESTRIL) 10 MG tablet Take 10 mg by mouth daily.    Historical Provider, MD  Multiple Vitamin (MULTIVITAMIN) tablet Take 1 tablet by mouth daily.    Historical Provider, MD  omega-3 acid ethyl esters (LOVAZA) 1 g capsule Take by mouth 2 (two) times daily.    Historical Provider, MD  pyridoxine (B-6) 100 MG tablet Take 100 mg by mouth daily.    Historical Provider, MD     Allergies Hydrocodone-acetaminophen; Ibuprofen; and Rosuvastatin  Family History  Problem Relation Age of Onset  . Emphysema Father     Social History Social History  Substance Use Topics  . Smoking status: Never Smoker  . Smokeless tobacco: Never Used  . Alcohol use No    Review of Systems  Constitutional: No fever/chills Eyes: No visual changes.   Cardiovascular: Denies chest pain. Respiratory: Denies shortness of breath. Gastrointestinal: As above Genitourinary: Negative for dysuria. Musculoskeletal: Negative for back pain. Skin: Negative for rash.   10-point ROS otherwise  negative.  ____________________________________________   PHYSICAL EXAM:  VITAL SIGNS: ED Triage Vitals [09/15/16 0820]  Enc Vitals Group     BP (!) 147/88     Pulse Rate 72     Resp 18     Temp      Temp src      SpO2 98 %     Weight 172 lb (78 kg)     Height 5\' 8"  (1.727 m)     Head Circumference      Peak Flow      Pain Score 7     Pain Loc      Pain Edu?      Excl. in GC?     Constitutional: Alert and oriented. No acute distress. Pleasant and interactive Eyes: Conjunctivae are normal.   Nose: No congestion/rhinnorhea. Mouth/Throat: Mucous membranes are moist.    Cardiovascular:  Normal rate, regular rhythm. Systolic murmur 2 out of 6.  Good peripheral circulation. Respiratory: Normal respiratory effort.  No retractions. Lungs CTAB. Gastrointestinal: Moderate tenderness to palpation in the left lower quadrant. No distention.  No CVA tenderness. Genitourinary: deferred Musculoskeletal: No lower extremity tenderness nor edema.  Warm and well perfused Neurologic:  Normal speech and language. No gross focal neurologic deficits are appreciated.  Skin:  Skin is warm, dry and intact. No rash noted. Psychiatric: Mood and affect are normal. Speech and behavior are normal.  ____________________________________________   LABS (all labs ordered are listed, but only abnormal results are displayed)  Labs Reviewed  CBC  COMPREHENSIVE METABOLIC PANEL  LIPASE, BLOOD   ____________________________________________  EKG  None ____________________________________________  RADIOLOGY  CT scan demonstrates area of inflammation just inferior to the left kidney ____________________________________________   PROCEDURES  Procedure(s) performed: No    Critical Care performed: No ____________________________________________   INITIAL IMPRESSION / ASSESSMENT AND PLAN / ED COURSE  Pertinent labs & imaging results that were available during my care of the patient were reviewed by me and considered in my medical decision making (see chart for details).  Patient presents with worsening left lower quadrant pain over the last 3 days, strongly suspect diverticulitis. We will check labs, give IV morphine and IV Zofran and obtain CT abdomen and pelvis and reevaluate.  Clinical Course as of Sep 15 1429  Mon Sep 15, 2016  1020 CO2: 25 [RK]  1038 Patient received morphine for pain and fell asleep, HR dropped to 30's but upon waking normalized. He feels improved   [RK]    Clinical Course User Index [RK] Johnny Everyobert Irianna Gilday, MD  Discussed CT results with Dr. Thea SilversmithMackenzie of urology, he  recommends outpatient follow-up given the possibility of possible recently passed kidney stone versus less likely malignancy  I discussed this with the patient. He feels no pain and feels much better. Suspect passed kidney stone but he will follow-up with urology as an outpatient for the above reason ____________________________________________   FINAL CLINICAL IMPRESSION(S) / ED DIAGNOSES  Final diagnoses:  Left lower quadrant pain      NEW MEDICATIONS STARTED DURING THIS VISIT:  New Prescriptions   No medications on file     Note:  This document was prepared using Dragon voice recognition software and may include unintentional dictation errors.    Johnny Everyobert Lynnell Fiumara, MD 09/15/16 [phone]

## 2016-09-15 NOTE — ED Triage Notes (Signed)
Reports abd pain for several days.  Seems worse at night.  Denies n/v/d or other sx

## 2016-09-15 NOTE — ED Notes (Signed)
Patient states as his pain is currently "tolerable" he would not like pain medication at this time.  Patient instructed to use call bell if pain increases.  Patient verbalized understanding.

## 2016-09-15 NOTE — ED Notes (Signed)
Pt ambulatory to toilet with no assist to attempt urine sample.

## 2016-09-16 LAB — URINE CULTURE: Culture: NO GROWTH

## 2016-09-23 ENCOUNTER — Ambulatory Visit: Payer: Medicare Other | Admitting: Urology

## 2016-09-23 ENCOUNTER — Encounter: Payer: Self-pay | Admitting: Urology

## 2016-09-23 VITALS — BP 152/75 | HR 84 | Ht 69.0 in | Wt 172.0 lb

## 2016-09-23 DIAGNOSIS — R9341 Abnormal radiologic findings on diagnostic imaging of renal pelvis, ureter, or bladder: Secondary | ICD-10-CM

## 2016-09-23 DIAGNOSIS — I330 Acute and subacute infective endocarditis: Secondary | ICD-10-CM | POA: Insufficient documentation

## 2016-09-23 DIAGNOSIS — N281 Cyst of kidney, acquired: Secondary | ICD-10-CM | POA: Diagnosis not present

## 2016-09-23 DIAGNOSIS — N183 Chronic kidney disease, stage 3 unspecified: Secondary | ICD-10-CM

## 2016-09-23 DIAGNOSIS — I639 Cerebral infarction, unspecified: Secondary | ICD-10-CM | POA: Insufficient documentation

## 2016-09-23 DIAGNOSIS — Z961 Presence of intraocular lens: Secondary | ICD-10-CM | POA: Insufficient documentation

## 2016-09-23 DIAGNOSIS — M1990 Unspecified osteoarthritis, unspecified site: Secondary | ICD-10-CM | POA: Insufficient documentation

## 2016-09-23 DIAGNOSIS — H532 Diplopia: Secondary | ICD-10-CM | POA: Insufficient documentation

## 2016-09-23 NOTE — Progress Notes (Signed)
09/23/2016 7:48 PM   Johnny [name] [birth year] [id number]  Referring provider: [facility] [address] [place], [place] [id number]  Chief Complaint  Patient presents with  . New Patient (Initial Visit)    Urothelial thickening    HPI: 65 year old male who presents today for her follow-up visit. He was seen in the emergency room on 09/15/2016 with abdominal pain, left lower quadrant x 3 days. At that time, CT abdomen with contrast showed evidence of urothelial thickening on the left renal pelvis, proximal ureter with a left perinephric stranding. There is no hydronephrosis or evidence of stone at that time. The patient's pain quickly resolved without recurrence.  No leukocytosis or change in baseline creatinine at time of ED presentation.  UA at the time was negative without evidence of wbc's or rbc's. Urine culture was negative.  No leukocytosis.  Patient denies a personal history of kidney stones or flank pain prior to this episode.  Personal history of CKD, stage 3.  No personal history of smoker.   No voiding issues including no history of gross hematuria, dysuria, urgency, or frequency.    PMH: Past Medical History:  Diagnosis Date  . Arthritis   . Cerebellar stroke (HCC)   . Complex partial seizures (HCC)   . Depression   . Diplopia   . Endocarditis   . Hyperlipidemia   . Hypertension   . Kidney failure   . Mitral valve regurgitation   . Osteoarthritis of both knees   . Renal insufficiency     Surgical History: Past Surgical History:  Procedure Laterality Date  . 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 CARDIAC VALVE REPLACEMENT  2013  . COLONOSCOPY    . HEMORRHOID SURGERY     SIMPLE LIGATION   . lens eye surgery   06/2013  . REMOVAL SECONDARY MEMBRANOUS CATARACT   09/27/2013  . REPAIR RETINAL DETACHMENT W/SCLERAL BUCKLE  12/22/2012  . TONSILLECTOMY      Home Medications:    Medication List       Accurate as of  09/23/16  7:48 PM. Always use your most recent med list.          acetaminophen 325 MG tablet Commonly known as:  TYLENOL Take 650 mg by mouth every 6 (six) hours as needed.   amoxicillin 500 MG capsule Commonly known as:  AMOXIL Take 500 mg by mouth once.   aspirin 81 MG tablet Take 81 mg by mouth daily.   atorvastatin 10 MG tablet Commonly known as:  LIPITOR Take 10 mg by mouth daily.   diphenhydrAMINE 25 mg capsule Commonly known as:  BENADRYL Take 25 mg by mouth every 6 (six) hours as needed.   lamoTRIgine 150 MG tablet Commonly known as:  LAMICTAL Take 150 mg by mouth daily.   lisinopril 10 MG tablet Commonly known as:  PRINIVIL,ZESTRIL Take 10 mg by mouth daily.   multivitamin tablet Take 1 tablet by mouth daily.   omega-3 acid ethyl esters 1 g capsule Commonly known as:  LOVAZA Take by mouth 2 (two) times daily.   PROLENSA 0.07 % Soln Generic drug:  Bromfenac Sodium Apply to eye.   pyridoxine 100 MG tablet Commonly known as:  B-6 Take 100 mg by mouth daily.       Allergies:  Allergies  Allergen Reactions  . Hydrocodone-Acetaminophen Nausea Only  . Ibuprofen Nausea Only  . Rosuvastatin     Pain and depression    Family History: Family  History  Problem Relation Age of Onset  . Emphysema Father   . Kidney cancer Neg Hx   . Prostate cancer Neg Hx     Social History:  reports that he has never smoked. He has never used smokeless tobacco. He reports that he does not drink alcohol or use drugs.  ROS: UROLOGY Frequent Urination?: No Hard to postpone urination?: No Burning/pain with urination?: No Get up at night to urinate?: Yes Leakage of urine?: No Urine stream starts and stops?: No Trouble starting stream?: No Do you have to strain to urinate?: No Blood in urine?: No Urinary tract infection?: No Sexually transmitted disease?: No Injury to kidneys or bladder?: No Painful intercourse?: No Weak stream?: No Erection problems?:  No Penile pain?: No  Gastrointestinal Nausea?: No Vomiting?: No Indigestion/heartburn?: No Diarrhea?: No Constipation?: No  Constitutional Fever: No Night sweats?: No Weight loss?: No Fatigue?: No  Skin Skin rash/lesions?: No Itching?: No  Eyes Blurred vision?: Yes Double vision?: No  Ears/Nose/Throat Sore throat?: No Sinus problems?: No  Hematologic/Lymphatic Swollen glands?: No Easy bruising?: No  Cardiovascular Leg swelling?: No Chest pain?: No  Respiratory Cough?: No Shortness of breath?: No  Endocrine Excessive thirst?: No  Musculoskeletal Back pain?: Yes Joint pain?: No  Neurological Headaches?: No Dizziness?: No  Psychologic Depression?: Yes Anxiety?: No  Physical Exam: BP (!) 152/75   Pulse 84   Ht 5\' 9"  (1.753 m)   Wt 172 lb (78 kg)   BMI 25.40 kg/m   Constitutional:  Alert and oriented, No acute distress.  Deep wife today. HEENT: [place] AT, moist mucus membranes.  Trachea midline, no masses. Cardiovascular: No clubbing, cyanosis, or edema. Respiratory: Normal respiratory effort, no increased work of breathing. GI: Abdomen is soft, nontender, nondistended, no abdominal masses GU: No CVA tenderness. Skin: No rashes, bruises or suspicious lesions. Neurologic: Grossly intact, no focal deficits, moving all 4 extremities. Psychiatric: Normal mood and affect.  Laboratory Data: Lab Results  Component Value Date   WBC 9.2 09/15/2016   HGB 14.3 09/15/2016   HCT 40.3 09/15/2016   MCV 90.2 09/15/2016   PLT 107 (L) 09/15/2016    Lab Results  Component Value Date   CREATININE 1.27 (H) 09/15/2016     Urinalysis Component     Latest Ref Rng & Units 09/15/2016  pH     5.0 - 8.0 7.0  Protein     NEGATIVE mg/dL NEGATIVE  Nitrite     NEGATIVE NEGATIVE  Color, Urine     YELLOW STRAW (A)  Appearance     CLEAR CLEAR (A)  Glucose     NEGATIVE mg/dL NEGATIVE  Bilirubin Urine     NEGATIVE NEGATIVE  Ketones, ur     NEGATIVE mg/dL  NEGATIVE  Specific Gravity, Urine     1.005 - 1.030 1.012  Hgb urine dipstick     NEGATIVE NEGATIVE  Leukocytes, UA     NEGATIVE NEGATIVE  RBC / HPF     0 - 5 RBC/hpf NONE SEEN  WBC, UA     0 - 5 WBC/hpf 0-5  Bacteria, UA     NONE SEEN NONE SEEN  Squamous Epithelial / LPF     NONE SEEN NONE SEEN   Pertinent Imaging: CLINICAL DATA:  65 year old male with left lower quadrant pain for 3 days. Initial encounter.  EXAM: CT ABDOMEN AND PELVIS WITH CONTRAST  TECHNIQUE: Multidetector CT imaging of the abdomen and pelvis was performed using the standard protocol following bolus administration of intravenous  contrast.  CONTRAST:  100mL ISOVUE-300 IOPAMIDOL (ISOVUE-300) INJECTION 61%  COMPARISON:  CT Abdomen and Pelvis 07/24/2014 and earlier.  FINDINGS: Lower chest: Lower lung volumes. Mild lower lobe atelectasis. No pericardial or pleural effusion. Mild chronic scarring along the anterior right middle lobe. Chronic aortic valve.  No upper abdominal free air.  Hepatobiliary: Negative liver and gallbladder.  Pancreas: Negative.  Spleen: Negative.  Adrenals/Urinary Tract: Negative adrenal glands. The right kidney is normal.  Left renal collecting system and proximal left ureter urothelial thickening and hyper enhancement is evident (series 2, images 37 through 51. There is associated fat stranding at the left renal hilum and along the proximal left ureter best seen on coronal image 48.  However, other renal enhancement and renal contrast excretion is within normal limits and appears symmetric among both kidneys. Chronic exophytic simple fluid density left renal cysts have not significantly changed. No urologic calculus identified. The distal ureters appears symmetric and within normal limits. Unremarkable urinary bladder.  Stomach/Bowel: Rectum is decompressed and negative. Distal sigmoid colon is negative. Mid sigmoid colon is decompressed and  negative. At the junction of the sigmoid and descending colon there is mild diverticulosis, but no active inflammation is evident. Occasional diverticulosis elsewhere in the left colon, no active inflammation.  Gas and stool throughout the transverse colon with mildly redundant flexures. Oral contrast has reached the ascending colon. Negative cecum, retrocecal appendix, and terminal ileum. No dilated small bowel. The stomach is distended with air in contrast. Duodenum is within normal limits.  Vascular/Lymphatic: Mild calcified aortic atherosclerosis and aortoiliac ectasia. Major arterial structures are patent. Portal venous system is patent.  No lymphadenopathy.  Reproductive: Negative.  Other: No pelvic free fluid.  Musculoskeletal: No acute osseous abnormality identified.  IMPRESSION: 1. Inflammatory changes about the left renal pelvis and proximal left ureter where urothelial thickening and hyper enhancement are new since the prior CT. Consider ascending urinary tract infection or other urothelial inflammatory process. No discrete mass. No changes of left pyelonephritis or obstructive uropathy. No urologic calculus identified. 2. Mild diverticulosis of the colon in the left lower quadrant with no active inflammation.   Electronically Signed   By: Odessa FlemingH  Hall M.D.   On: 09/15/2016 11:27  CT scan personally reviewed today and with the patient.  Assessment & Plan:   1. Abnormal radiologic findings on diagnostic imaging of renal pelvis, ureter, or bladder Findings on CT scan favor inflammatory process although somewhat unusual and absence of urinary tract infection/urinary symptoms. Findings also could be consistent with recently passed stone versus neoplasm.  Options for follow-up were discussed. These include proceeded to the operating room for ureteroscopy and possible biopsy versus repeat imaging in approximately 6 weeks.  Patient is most interested to  proceeding with CT urogram for further evaluation/ assess for resolution.   Urogram with delayed imaging will provide additional information about possibility of intraluminal filling defects, etc.  Plan first CTU in 6 weeks, we'll call patient with results.   2. Acquired renal cyst of left kidney Simple, asymptomatic.  No further work up or intervention.    3. CKD (chronic kidney disease) stage 3, GFR 30-59 ml/min Cr at baseline.   Return for will call with results.  Vanna ScotlandAshley Kadence Mikkelson, MD  [facility] [address], [address] [place], [place] [address] [phone]

## 2016-10-06 ENCOUNTER — Other Ambulatory Visit: Payer: Self-pay | Admitting: Urology

## 2016-11-10 ENCOUNTER — Ambulatory Visit
Admission: RE | Admit: 2016-11-10 | Discharge: 2016-11-10 | Disposition: A | Discharge status: Home / Self Care | Payer: Medicare Other | Source: Ambulatory Visit | Attending: Urology | Admitting: Urology

## 2016-11-10 ENCOUNTER — Ambulatory Visit: Admission: RE | Admit: 2016-11-10 | Payer: Medicare Other | Source: Ambulatory Visit

## 2016-11-10 DIAGNOSIS — R9341 Abnormal radiologic findings on diagnostic imaging of renal pelvis, ureter, or bladder: Secondary | ICD-10-CM | POA: Diagnosis present

## 2016-11-10 DIAGNOSIS — N281 Cyst of kidney, acquired: Secondary | ICD-10-CM | POA: Diagnosis not present

## 2016-11-10 MED ORDER — IOPAMIDOL (ISOVUE-300) INJECTION 61%
100.0000 mL | Freq: Once | INTRAVENOUS | Status: AC | PRN
  Administered 2016-11-10: 100 mL via INTRAVENOUS

## 2017-04-20 IMAGING — CT CT ABD-PEL WO/W CM
3 of 10 series · 13 of 46 positions shown, 19 images · IV contrast (iopamidol)
Comparison: CT abdomen/pelvis dated 09/15/2016

CLINICAL DATA: Follow-up urothelial enhancement on CT

EXAM:
CT ABDOMEN AND PELVIS WITHOUT AND WITH CONTRAST
TECHNIQUE: Multidetector CT imaging of the abdomen and pelvis was performed
following the standard protocol before and following the bolus
administration of intravenous contrast.
CONTRAST:  100mL QA3TH6-4CC IOPAMIDOL (QA3TH6-4CC) INJECTION 61%

[Series 4: hematuria < 45 with 100s · axial · 0.68mm/px · z∈[-783,-418]mm · 8 of 95 slices shown, 13 images]
[im 11/95  soft-tissue]
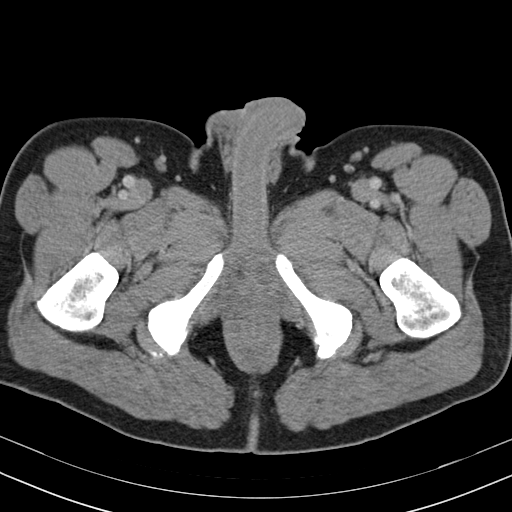
[im 11/95  bone]
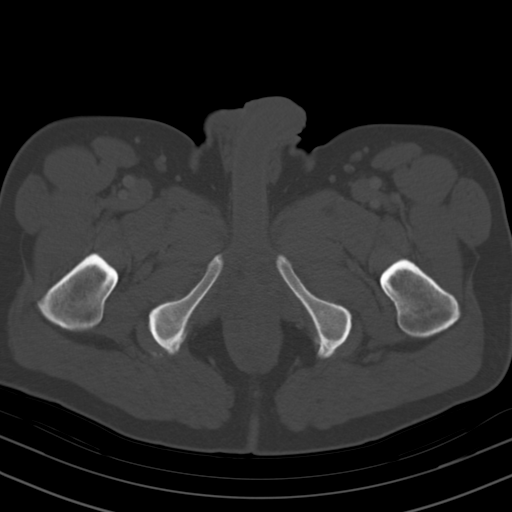
[im 21/95  soft-tissue]
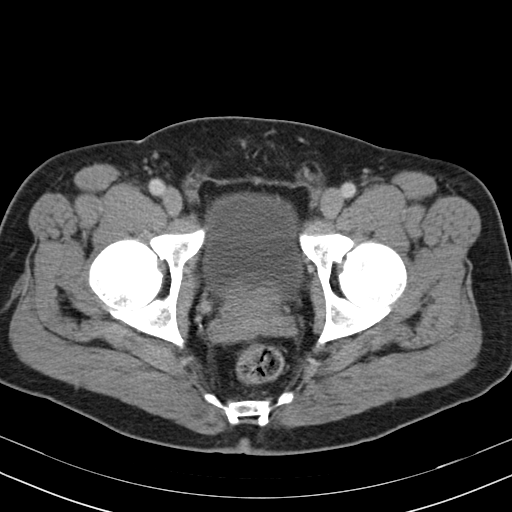
[im 32/95  soft-tissue]
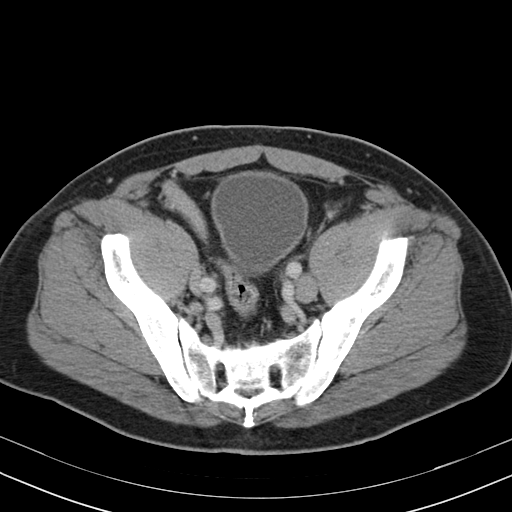
[im 42/95  soft-tissue]
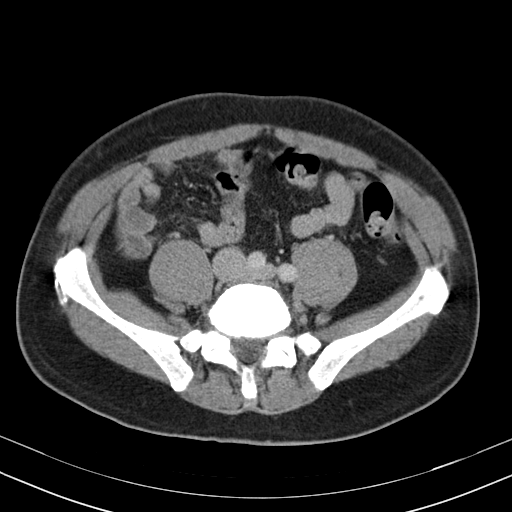
[im 53/95  soft-tissue]
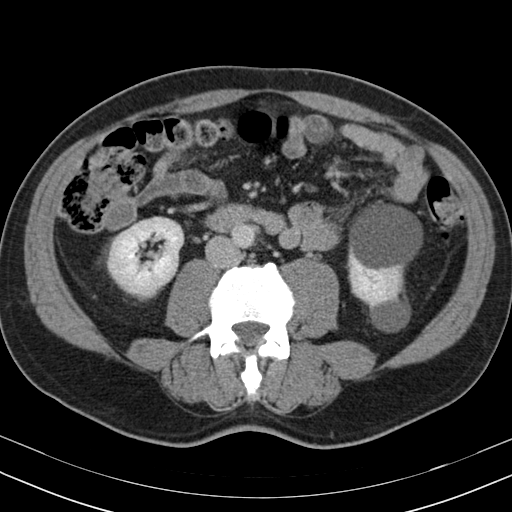
[im 53/95  lung]
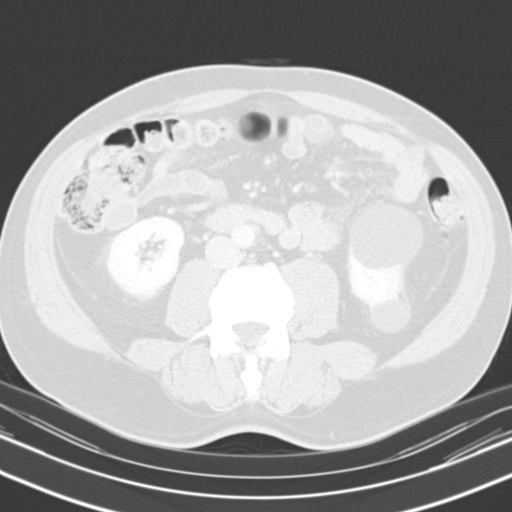
[im 63/95  soft-tissue]
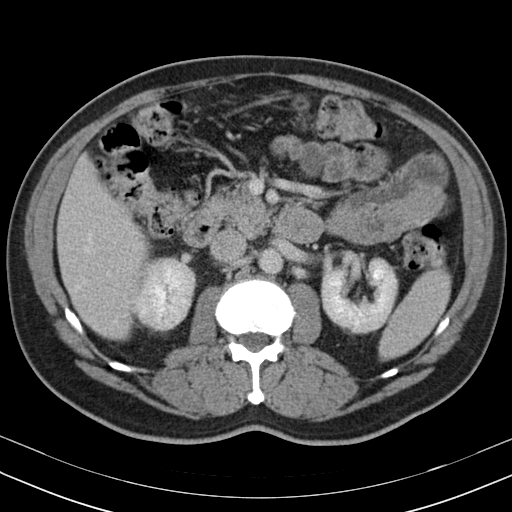
[im 63/95  lung]
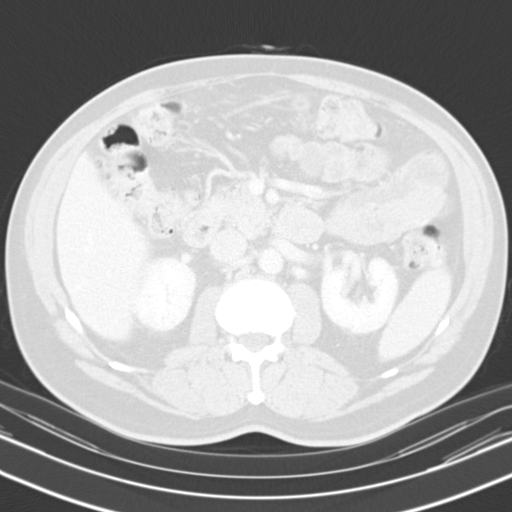
[im 74/95  soft-tissue]
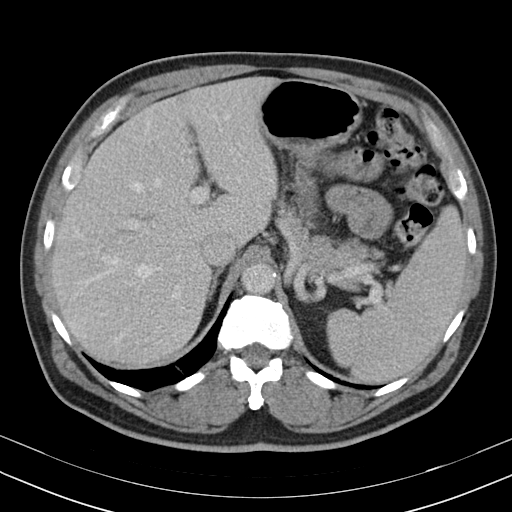
[im 74/95  lung]
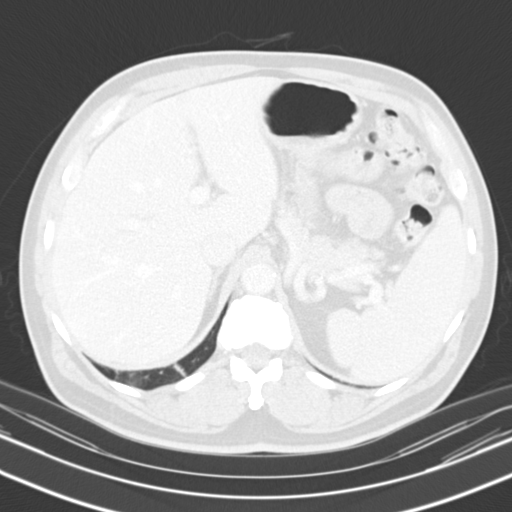
[im 84/95  soft-tissue]
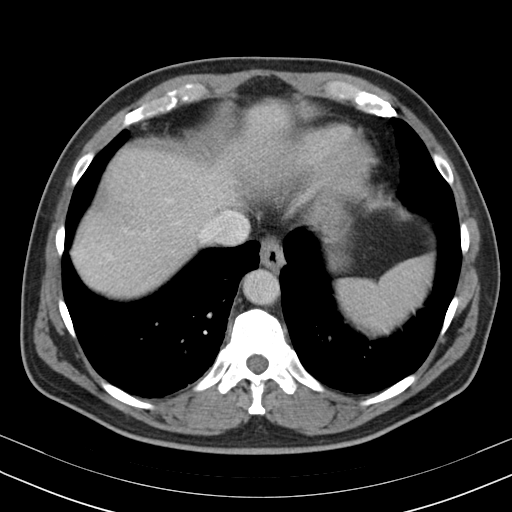
[im 84/95  lung]
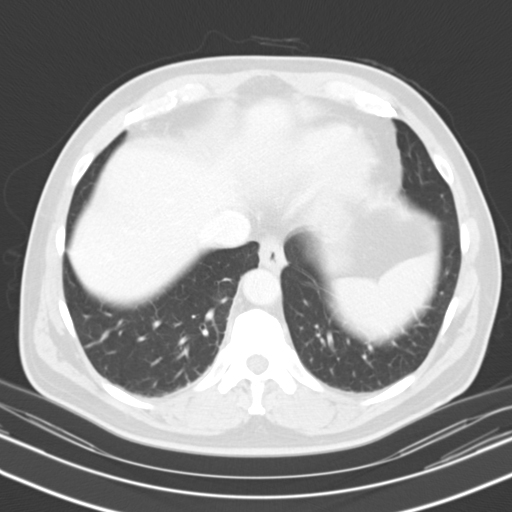

[Series 9: hematuria > 45 10 min delay · axial · delayed · 0.89mm/px · z∈[-645,-540]mm · 3 of 94 slices shown]
[im 11/94  soft-tissue]
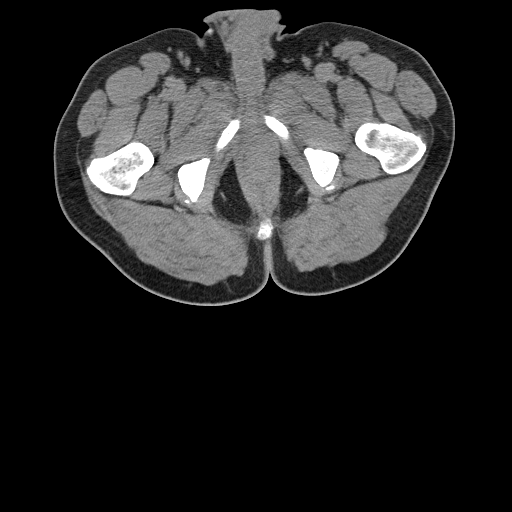
[im 21/94  soft-tissue]
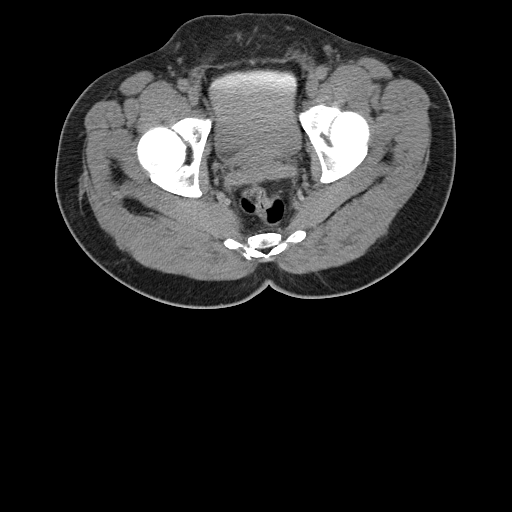
[im 32/94  soft-tissue]
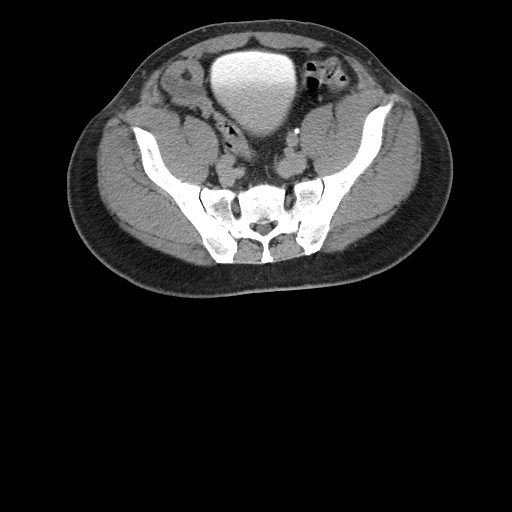

[Series 606: delay coronal · coronal · delayed · 0.91mm/px · 2 of 115 slices shown, 3 images]
[im 39/115  soft-tissue]
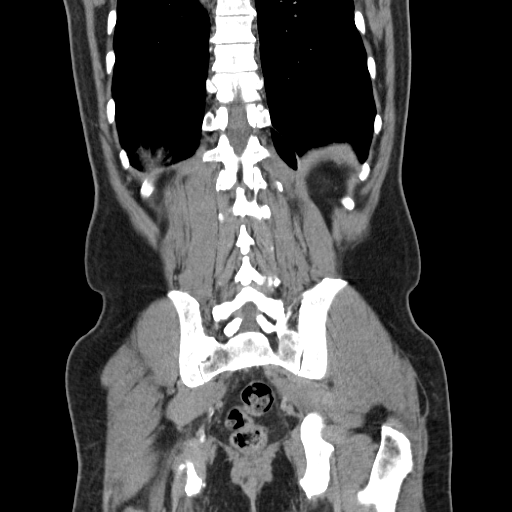
[im 39/115  bone]
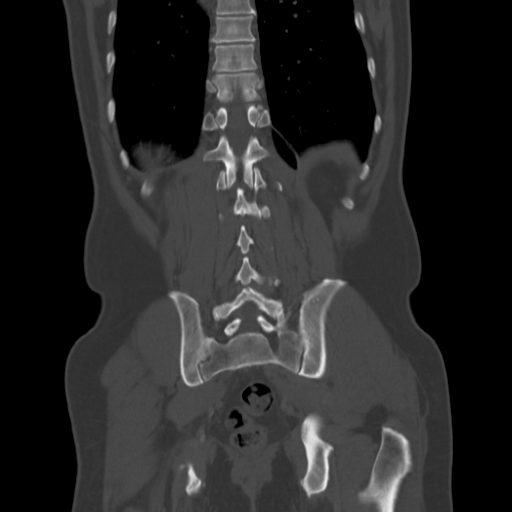
[im 77/115  soft-tissue]
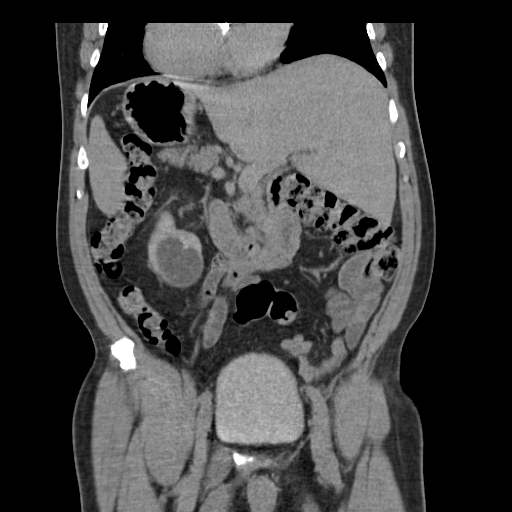

[13 of 46 positions shown; findings below may reference images not displayed]

FINDINGS: Lower chest: Lung bases are clear.

Hepatobiliary: Liver is within normal limits. No
suspicious/enhancing hepatic lesions.

Gallbladder is within normal limits. No intrahepatic or extrahepatic
ductal dilatation.

Pancreas: Within normal limits.

Spleen: Within normal limits.

Adrenals/Urinary Tract: Adrenal glands are within normal limits.

Two left lower pole renal cysts measuring up to 5.3 cm (series 4/
image 42). No enhancing renal lesions. Right kidney is within normal
limits.

No renal, ureteral, or bladder calculi.  No hydronephrosis.

No urothelial thickening or enhancement is noted bilaterally.

On delayed imaging, there are no filling defects in the bilateral
opacified proximal collecting systems, ureters, or bladder.

Bladder is within normal limits.

Stomach/Bowel: Stomach is within normal limits.

No evidence of bowel obstruction.

Appendix is not discretely visualized.

Vascular/Lymphatic: No evidence of abdominal aortic aneurysm.

No suspicious abdominopelvic lymphadenopathy.

Reproductive: Prostate is grossly unremarkable.

Other: No abdominopelvic ascites.

Musculoskeletal: Degenerative changes of the visualized
thoracolumbar spine.
IMPRESSION: Prior urothelial enhancement involving the left renal collecting
system/proximal ureter is no longer evident, likely
infectious/inflammatory.

Two left lower pole renal cyst measuring up to 5.3 cm, benign
(Bosniak I). No suspicious/enhancing renal lesions.

## 2017-09-11 ENCOUNTER — Encounter: Admission: RE | Payer: Self-pay | Source: Ambulatory Visit

## 2017-09-11 ENCOUNTER — Ambulatory Visit
Admission: RE | Admit: 2017-09-11 | Payer: Medicare Other | Source: Ambulatory Visit | Admitting: Unknown Physician Specialty

## 2017-09-11 SURGERY — COLONOSCOPY WITH PROPOFOL
Anesthesia: General

## 2018-11-26 ENCOUNTER — Encounter: Payer: Self-pay | Admitting: *Deleted

## 2018-11-29 ENCOUNTER — Encounter
Admission: RE | Disposition: A | Discharge status: Home / Self Care | Payer: Self-pay | Source: Home / Self Care | Attending: Unknown Physician Specialty

## 2018-11-29 ENCOUNTER — Encounter: Payer: Self-pay | Admitting: *Deleted

## 2018-11-29 ENCOUNTER — Ambulatory Visit
Admission: RE | Admit: 2018-11-29 | Discharge: 2018-11-29 | Disposition: A | Discharge status: Home / Self Care | Payer: Medicare Other | Attending: Unknown Physician Specialty | Admitting: Unknown Physician Specialty

## 2018-11-29 ENCOUNTER — Ambulatory Visit: Payer: Medicare Other | Admitting: Anesthesiology

## 2018-11-29 DIAGNOSIS — Z7982 Long term (current) use of aspirin: Secondary | ICD-10-CM | POA: Insufficient documentation

## 2018-11-29 DIAGNOSIS — Z86010 Personal history of colonic polyps: Secondary | ICD-10-CM | POA: Diagnosis present

## 2018-11-29 DIAGNOSIS — Z952 Presence of prosthetic heart valve: Secondary | ICD-10-CM | POA: Diagnosis not present

## 2018-11-29 DIAGNOSIS — F329 Major depressive disorder, single episode, unspecified: Secondary | ICD-10-CM | POA: Diagnosis not present

## 2018-11-29 DIAGNOSIS — Z885 Allergy status to narcotic agent status: Secondary | ICD-10-CM | POA: Insufficient documentation

## 2018-11-29 DIAGNOSIS — Z8673 Personal history of transient ischemic attack (TIA), and cerebral infarction without residual deficits: Secondary | ICD-10-CM | POA: Diagnosis not present

## 2018-11-29 DIAGNOSIS — E785 Hyperlipidemia, unspecified: Secondary | ICD-10-CM | POA: Insufficient documentation

## 2018-11-29 DIAGNOSIS — Z1211 Encounter for screening for malignant neoplasm of colon: Secondary | ICD-10-CM | POA: Insufficient documentation

## 2018-11-29 DIAGNOSIS — Z888 Allergy status to other drugs, medicaments and biological substances status: Secondary | ICD-10-CM | POA: Insufficient documentation

## 2018-11-29 DIAGNOSIS — Z950 Presence of cardiac pacemaker: Secondary | ICD-10-CM | POA: Insufficient documentation

## 2018-11-29 DIAGNOSIS — Z7901 Long term (current) use of anticoagulants: Secondary | ICD-10-CM | POA: Diagnosis not present

## 2018-11-29 DIAGNOSIS — K640 First degree hemorrhoids: Secondary | ICD-10-CM | POA: Diagnosis not present

## 2018-11-29 DIAGNOSIS — I10 Essential (primary) hypertension: Secondary | ICD-10-CM | POA: Diagnosis not present

## 2018-11-29 DIAGNOSIS — Z886 Allergy status to analgesic agent status: Secondary | ICD-10-CM | POA: Insufficient documentation

## 2018-11-29 DIAGNOSIS — Z7951 Long term (current) use of inhaled steroids: Secondary | ICD-10-CM | POA: Diagnosis not present

## 2018-11-29 DIAGNOSIS — Z79899 Other long term (current) drug therapy: Secondary | ICD-10-CM | POA: Insufficient documentation

## 2018-11-29 HISTORY — DX: Cardiac arrhythmia, unspecified: I49.9

## 2018-11-29 HISTORY — DX: Elevated white blood cell count, unspecified: D72.829

## 2018-11-29 HISTORY — DX: Stenosis of other cardiac prosthetic devices, implants and grafts, initial encounter: T82.857A

## 2018-11-29 HISTORY — DX: Presence of intraocular lens: Z96.1

## 2018-11-29 HISTORY — DX: Presence of cardiac pacemaker: Z95.0

## 2018-11-29 HISTORY — PX: COLONOSCOPY WITH PROPOFOL: SHX5780

## 2018-11-29 LAB — PROTIME-INR
INR: 1.16
Prothrombin Time: 14.7 seconds (ref 11.4–15.2)

## 2018-11-29 SURGERY — COLONOSCOPY WITH PROPOFOL
Anesthesia: General

## 2018-11-29 MED ORDER — PROPOFOL 500 MG/50ML IV EMUL
INTRAVENOUS | Status: AC
  Filled 2018-11-29: qty 50

## 2018-11-29 MED ORDER — MIDAZOLAM HCL 2 MG/2ML IJ SOLN
INTRAMUSCULAR | Status: DC | PRN
  Administered 2018-11-29: 2 mg via INTRAVENOUS

## 2018-11-29 MED ORDER — PIPERACILLIN-TAZOBACTAM 3.375 G IVPB
INTRAVENOUS | Status: AC
  Filled 2018-11-29: qty 50

## 2018-11-29 MED ORDER — SODIUM CHLORIDE 0.9 % IV SOLN: INTRAVENOUS | Status: DC

## 2018-11-29 MED ORDER — FENTANYL CITRATE (PF) 100 MCG/2ML IJ SOLN
INTRAMUSCULAR | Status: AC
  Filled 2018-11-29: qty 2

## 2018-11-29 MED ORDER — FENTANYL CITRATE (PF) 100 MCG/2ML IJ SOLN
INTRAMUSCULAR | Status: DC | PRN
  Administered 2018-11-29: 50 ug via INTRAVENOUS

## 2018-11-29 MED ORDER — PROPOFOL 500 MG/50ML IV EMUL
INTRAVENOUS | Status: DC | PRN
  Administered 2018-11-29: 120 ug/kg/min via INTRAVENOUS

## 2018-11-29 MED ORDER — MIDAZOLAM HCL 2 MG/2ML IJ SOLN
INTRAMUSCULAR | Status: AC
  Filled 2018-11-29: qty 2

## 2018-11-29 MED ORDER — SODIUM CHLORIDE 0.9 % IV SOLN
INTRAVENOUS | Status: DC
  Administered 2018-11-29: 15:00:00 via INTRAVENOUS

## 2018-11-29 MED ORDER — PIPERACILLIN-TAZOBACTAM 3.375 G IVPB 30 MIN
3.3750 g | Freq: Once | INTRAVENOUS | Status: DC
  Filled 2018-11-29: qty 50

## 2018-11-29 NOTE — Anesthesia Preprocedure Evaluation (Addendum)
Anesthesia Evaluation  Patient identified by MRN, date of birth, ID band Patient awake    Reviewed: Allergy & Precautions, H&P , NPO status , reviewed documented beta blocker date and time   Airway Mallampati: III  TM Distance: >3 FB Neck ROM: full    Dental  (+) Teeth Intact, Chipped   Pulmonary    Pulmonary exam normal        Cardiovascular hypertension, + dysrhythmias + pacemaker  Rhythm:regular     Neuro/Psych Seizures -,  PSYCHIATRIC DISORDERS Depression CVA    GI/Hepatic   Endo/Other    Renal/GU Renal disease     Musculoskeletal  (+) Arthritis ,   Abdominal   Peds  Hematology   Anesthesia Other Findings Past Medical History: No date: Arthritis No date: Cerebellar stroke (HCC) No date: Complex partial seizures (HCC) No date: Depression No date: Diplopia No date: Dysrhythmia     Comment:  PVC No date: Endocarditis No date: Hyperlipidemia No date: Hypertension No date: Kidney failure No date: Leukocytosis No date: Mitral valve regurgitation No date: Osteoarthritis of both knees No date: Presence of permanent cardiac pacemaker No date: Prosthetic aortic valve stenosis No date: Pseudophakia of both eyes No date: Renal insufficiency  Past Surgical History: 11/04/2011: AORTIC VALVE REPLACEMENT 2013: CARDIAC VALVE REPLACEMENT No date: COLONOSCOPY No date: EYE SURGERY No date: HEMORRHOID SURGERY     Comment:  SIMPLE LIGATION  No date: INSERT / REPLACE / REMOVE PACEMAKER 06/2013: lens eye surgery  09/27/2013: REMOVAL SECONDARY MEMBRANOUS CATARACT  12/22/2012: REPAIR RETINAL DETACHMENT W/SCLERAL BUCKLE No date: TONSILLECTOMY  BMI    Body Mass Index:  28.89 kg/m      Reproductive/Obstetrics                            Anesthesia Physical Anesthesia Plan  ASA: III  Anesthesia Plan: General   Post-op Pain Management:    Induction: Intravenous  PONV Risk Score and Plan: 2  and Treatment may vary due to age or medical condition and TIVA  Airway Management Planned: Nasal Cannula and Natural Airway  Additional Equipment:   Intra-op Plan:   Post-operative Plan:   Informed Consent: I have reviewed the patients History and Physical, chart, labs and discussed the procedure including the risks, benefits and alternatives for the proposed anesthesia with the patient or authorized representative who has indicated his/her understanding and acceptance.     Dental Advisory Given  Plan Discussed with: CRNA  Anesthesia Plan Comments:         Anesthesia Quick Evaluation

## 2018-11-29 NOTE — Transfer of Care (Signed)
Immediate Anesthesia Transfer of Care Note  Patient: Johnny [name]  Procedure(s) Performed: COLONOSCOPY WITH PROPOFOL (N/A )  Patient Location: PACU  Anesthesia Type:General  Level of Consciousness: awake and sedated  Airway & Oxygen Therapy: Patient Spontanous Breathing and Patient connected to nasal cannula oxygen  Post-op Assessment: Report given to RN and Post -op Vital signs reviewed and stable  Post vital signs: Reviewed and stable  Last Vitals:  Vitals Value Taken Time  BP    Temp    Pulse    Resp    SpO2      Last Pain:  Vitals:   11/29/18 1500  TempSrc:   PainSc: 0-No pain      Patients Stated Pain Goal: 0 (11/29/18 1500)  Complications: No apparent anesthesia complications

## 2018-11-29 NOTE — Op Note (Signed)
[facility] Patient Name: Essa Handshoe Procedure Date: 11/29/2018 3:07 PM MRN: [id number] Account #: [id number] Date of Birth: May 25, 1951 Admit Type: Outpatient Age: 68 Room: [facility] ENDO ROOM 1 Gender: Male Note Status: Finalized Procedure:            Colonoscopy Indications:          High risk colon cancer surveillance: Personal history                        of colonic polyps Providers:            Scot Jun, MD Referring MD:         Barbette Reichmann, MD (Referring MD) Medicines:            Propofol per Anesthesia Complications:        No immediate complications. Procedure:            Pre-Anesthesia Assessment:                       - After reviewing the risks and benefits, the patient                        was deemed in satisfactory condition to undergo the                        procedure.                       After obtaining informed consent, the colonoscope was                        passed under direct vision. Throughout the procedure,                        the patient's blood pressure, pulse, and oxygen                        saturations were monitored continuously. The                        Colonoscope was introduced through the anus and                        advanced to the the cecum, identified by appendiceal                        orifice and ileocecal valve. The colonoscopy was                        performed without difficulty. The patient tolerated the                        procedure well. The quality of the bowel preparation                        was good. Findings:      Internal hemorrhoids were found during endoscopy. The hemorrhoids were       small and Grade I (internal hemorrhoids that do not prolapse).      The exam was otherwise without abnormality. Impression:           - Internal  hemorrhoids.                       - The examination was otherwise normal.                       - No specimens  collected. Recommendation:       - Repeat colonoscopy in 5 years for surveillance. Scot Jun, MD 11/29/2018 3:36:20 PM This report has been signed electronically. Number of Addenda: 0 Note Initiated On: 11/29/2018 3:07 PM Scope Withdrawal Time: 0 hours 5 minutes 27 seconds  Total Procedure Duration: 0 hours 12 minutes 50 seconds       [facility]

## 2018-11-29 NOTE — H&P (Signed)
Primary Care Physician:  Barbette Reichmann, MD Primary Gastroenterologist:  Dr. Mechele Collin  Pre-Procedure History & Physical: HPI:  Johnny [name] is a 68 y.o. male is here for an colonoscopy.   Past Medical History:  Diagnosis Date  . Arthritis   . Cerebellar stroke (HCC)   . Complex partial seizures (HCC)   . Depression   . Diplopia   . Dysrhythmia    PVC  . Endocarditis   . Hyperlipidemia   . Hypertension   . Kidney failure   . Leukocytosis   . Mitral valve regurgitation   . Osteoarthritis of both knees   . Presence of permanent cardiac pacemaker   . Prosthetic aortic valve stenosis   . Pseudophakia of both eyes   . Renal insufficiency     Past Surgical History:  Procedure Laterality Date  . 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 CARDIAC VALVE REPLACEMENT  2013  . COLONOSCOPY    . EYE SURGERY    . HEMORRHOID SURGERY     SIMPLE LIGATION   . INSERT / REPLACE / REMOVE PACEMAKER    . lens eye surgery   06/2013  . REMOVAL SECONDARY MEMBRANOUS CATARACT   09/27/2013  . REPAIR RETINAL DETACHMENT W/SCLERAL BUCKLE  12/22/2012  . TONSILLECTOMY      Prior to Admission medications   Medication Sig Start Date End Date Taking? Authorizing Provider  aspirin 81 MG tablet Take 81 mg by mouth daily.   Yes [provider]  atorvastatin (LIPITOR) 10 MG tablet Take 10 mg by mouth daily.   Yes [provider]  Bromfenac Sodium (PROLENSA) 0.07 % SOLN Apply to eye.   Yes [provider]  diphenhydrAMINE (BENADRYL) 25 mg capsule Take 25 mg by mouth every 6 (six) hours as needed.   Yes [provider]  lamoTRIgine (LAMICTAL) 150 MG tablet Take 150 mg by mouth daily.   Yes [provider]  lisinopril (PRINIVIL,ZESTRIL) 10 MG tablet Take 10 mg by mouth daily.   Yes [provider]  metoprolol succinate (TOPROL-XL) 25 MG 24 hr tablet Take 25 mg by mouth daily.   Yes [provider]  Multiple Vitamin (MULTIVITAMIN) tablet Take 1  tablet by mouth daily.   Yes [provider]  omega-3 acid ethyl esters (LOVAZA) 1 g capsule Take by mouth 2 (two) times daily.   Yes [provider]  warfarin (COUMADIN) 3 MG tablet Take 3 mg by mouth daily.   Yes [provider]  acetaminophen (TYLENOL) 325 MG tablet Take 650 mg by mouth every 6 (six) hours as needed.    [provider]  amoxicillin (AMOXIL) 500 MG capsule Take 500 mg by mouth once.    [provider]  fluticasone (FLONASE) 50 MCG/ACT nasal spray Place into both nostrils daily.    [provider]  pyridoxine (B-6) 100 MG tablet Take 100 mg by mouth daily.    [provider]  zinc gluconate 50 MG tablet Take 50 mg by mouth daily.    [provider]    Allergies as of 10/07/2018 - Review Complete 11/11/2016  Allergen Reaction Noted  . Hydrocodone-acetaminophen Nausea Only 12/29/2015  . Ibuprofen Nausea Only 12/29/2015  . Rosuvastatin  12/29/2015    Family History  Problem Relation Age of Onset  . Emphysema Father   . Kidney cancer Neg Hx   . Prostate cancer Neg Hx     Social History   Socioeconomic History  . Marital status: Married  Spouse name: Not on file  . Number of children: Not on file  . Years of education: Not on file  . Highest education level: Not on file  Occupational History  . Not on file  Social Needs  . Financial resource strain: Not on file  . Food insecurity:    Worry: Not on file    Inability: Not on file  . Transportation needs:    Medical: Not on file    Non-medical: Not on file  Tobacco Use  . Smoking status: Never Smoker  . Smokeless tobacco: Never Used  Substance and Sexual Activity  . Alcohol use: No  . Drug use: No  . Sexual activity: Not on file  Lifestyle  . Physical activity:    Days per week: Not on file    Minutes per session: Not on file  . Stress: Not on file  Relationships  . Social connections:    Talks on phone: Not on file    Gets  together: Not on file    Attends religious service: Not on file    Active member of club or organization: Not on file    Attends meetings of clubs or organizations: Not on file    Relationship status: Not on file  . Intimate partner violence:    Fear of current or ex partner: Not on file    Emotionally abused: Not on file    Physically abused: Not on file    Forced sexual activity: Not on file  Other Topics Concern  . Not on file  Social History Narrative  . Not on file    Review of Systems: See HPI, otherwise negative ROS  Physical Exam: BP 110/72   Pulse 92   Temp (!) 97.3 F (36.3 C) (Tympanic)   Resp 18   Ht 5\' 8"  (1.727 m)   Wt 86.2 kg   SpO2 98%   BMI 28.89 kg/m  General:   Alert,  pleasant and cooperative in NAD Head:  Normocephalic and atraumatic. Neck:  Supple; no masses or thyromegaly. Lungs:  Clear throughout to auscultation.    Heart:  Regular rate and rhythm. Abdomen:  Soft, nontender and nondistended. Normal bowel sounds, without guarding, and without rebound.   Neurologic:  Alert and  oriented x4;  grossly normal neurologically.  Impression/Plan: Johnny [name] is here for an colonoscopy to be performed for [facility] colon polyps.  Risks, benefits, limitations, and alternatives regarding  colonoscopy have been reviewed with the patient.  Questions have been answered.  All parties agreeable.   Lynnae Prude, MD  11/29/2018, 2:50 PM

## 2018-11-29 NOTE — Anesthesia Postprocedure Evaluation (Signed)
Anesthesia Post Note  Patient: Johnny [name]  Procedure(s) Performed: COLONOSCOPY WITH PROPOFOL (N/A )  Patient location during evaluation: Endoscopy Anesthesia Type: General Level of consciousness: awake and alert Pain management: pain level controlled Vital Signs Assessment: post-procedure vital signs reviewed and stable Respiratory status: spontaneous breathing, nonlabored ventilation, respiratory function stable and patient connected to nasal cannula oxygen Cardiovascular status: blood pressure returned to baseline and stable Postop Assessment: no apparent nausea or vomiting Anesthetic complications: no     Last Vitals:  Vitals:   11/29/18 1558 11/29/18 1608  BP: 113/63 121/64  Pulse: 75 75  Resp: 11 12  Temp:    SpO2: 99% 100%    Last Pain:  Vitals:   11/29/18 1608  TempSrc:   PainSc: 0-No pain                 Cleda Mccreedy Ragnar Waas

## 2018-11-29 NOTE — Anesthesia Procedure Notes (Signed)
Performed by: Cook-Martin, Yliana Gravois Pre-anesthesia Checklist: Patient identified, Emergency Drugs available, Suction available, Patient being monitored and Timeout performed Patient Re-evaluated:Patient Re-evaluated prior to induction Oxygen Delivery Method: Nasal cannula Preoxygenation: Pre-oxygenation with 100% oxygen Induction Type: IV induction Placement Confirmation: positive ETCO2 and CO2 detector       

## 2018-11-29 NOTE — Anesthesia Post-op Follow-up Note (Signed)
Anesthesia QCDR form completed.        

## 2018-12-01 ENCOUNTER — Encounter: Payer: Self-pay | Admitting: Unknown Physician Specialty

## 2020-04-06 ENCOUNTER — Ambulatory Visit
Admission: EM | Admit: 2020-04-06 | Discharge: 2020-04-06 | Disposition: A | Discharge status: Home / Self Care | Payer: Medicare PPO | Attending: Family Medicine | Admitting: Family Medicine

## 2020-04-06 ENCOUNTER — Other Ambulatory Visit: Payer: Self-pay

## 2020-04-06 ENCOUNTER — Encounter: Payer: Self-pay | Admitting: Emergency Medicine

## 2020-04-06 DIAGNOSIS — S90861A Insect bite (nonvenomous), right foot, initial encounter: Secondary | ICD-10-CM

## 2020-04-06 DIAGNOSIS — L03115 Cellulitis of right lower limb: Secondary | ICD-10-CM | POA: Diagnosis not present

## 2020-04-06 DIAGNOSIS — W57XXXA Bitten or stung by nonvenomous insect and other nonvenomous arthropods, initial encounter: Secondary | ICD-10-CM

## 2020-04-06 MED ORDER — CEPHALEXIN 500 MG PO CAPS: 500.0000 mg | ORAL_CAPSULE | Freq: Two times a day (BID) | ORAL | 0 refills | Status: AC

## 2020-04-06 NOTE — ED Provider Notes (Signed)
[facility]    CSN: [id number] Arrival date & time: 04/06/20  1833      History   Chief Complaint Chief Complaint  Patient presents with  . Insect Bite    HPI Johnny [name] is a 69 y.o. male.   69 yo male with a c/o insect bites to his right ankle 2 days ago with subsequent swelling, redness, tenderness and blistering. Denies any fevers, chills. Has tried otc benadryl cream without relief.      Past Medical History:  Diagnosis Date  . Arthritis   . Cerebellar stroke (HCC)   . Complex partial seizures (HCC)   . Depression   . Diplopia   . Dysrhythmia    PVC  . Endocarditis   . Hyperlipidemia   . Hypertension   . Kidney failure   . Leukocytosis   . Mitral valve regurgitation   . Osteoarthritis of both knees   . Presence of permanent cardiac pacemaker   . Prosthetic aortic valve stenosis   . Pseudophakia of both eyes   . Renal insufficiency     Patient Active Problem List   Diagnosis Date Noted  . Stroke (HCC) 09/23/2016  . Pseudophakia, both eyes 09/23/2016  . Diplopia 09/23/2016  . Arthritis 09/23/2016  . Bacterial endocarditis 09/23/2016  . CKD (chronic kidney disease) stage 3, GFR 30-59 ml/min 03/21/2016  . Primary osteoarthritis of both knees 01/05/2015  . Valvular heart disease 12/06/2014  . Essential hypertension 11/16/2014  . 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 Complex partial seizure (HCC) 11/11/2013  . History of cerebellar stroke 11/10/2013  . Aortic valve replaced 01/13/2012  . Aortic insufficiency and aortic stenosis 07/31/2011  . Mild tricuspid regurgitation 07/31/2011  . Mild mitral regurgitation 07/31/2011  . HLD (hyperlipidemia) 07/31/2011  . Depression 07/31/2011    Past Surgical History:  Procedure Laterality Date  . 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 CARDIAC VALVE REPLACEMENT  2013  . COLONOSCOPY    . COLONOSCOPY WITH PROPOFOL N/A 11/29/2018   Procedure: COLONOSCOPY WITH PROPOFOL;  Surgeon:  Scot Jun, MD;  Location: [facility] ENDOSCOPY;  Service: Endoscopy;  Laterality: N/A;  . EYE SURGERY    . HEMORRHOID SURGERY     SIMPLE LIGATION   . INSERT / REPLACE / REMOVE PACEMAKER    . lens eye surgery   06/2013  . REMOVAL SECONDARY MEMBRANOUS CATARACT   09/27/2013  . REPAIR RETINAL DETACHMENT W/SCLERAL BUCKLE  12/22/2012  . TONSILLECTOMY         Home Medications    Prior to Admission medications   Medication Sig Start Date End Date Taking? Authorizing Provider  acetaminophen (TYLENOL) 325 MG tablet Take 650 mg by mouth every 6 (six) hours as needed.   Yes [provider]  amoxicillin (AMOXIL) 500 MG capsule Take 500 mg by mouth once.   Yes [provider]  aspirin 81 MG tablet Take 81 mg by mouth daily.   Yes [provider]  atorvastatin (LIPITOR) 10 MG tablet Take 10 mg by mouth daily.   Yes [provider]  diphenhydrAMINE (BENADRYL) 25 mg capsule Take 25 mg by mouth every 6 (six) hours as needed.   Yes [provider]  lamoTRIgine (LAMICTAL) 150 MG tablet Take 150 mg by mouth daily.   Yes [provider]  lisinopril (PRINIVIL,ZESTRIL) 10 MG tablet Take 10 mg by mouth daily.   Yes [provider]  Melatonin 10 MG TABS Take 1 tablet by mouth at bedtime.  Yes [provider]  metoprolol succinate (TOPROL-XL) 25 MG 24 hr tablet Take 25 mg by mouth daily.   Yes [provider]  Multiple Vitamin (MULTIVITAMIN) tablet Take 1 tablet by mouth daily.   Yes [provider]  warfarin (COUMADIN) 3 MG tablet Take 3 mg by mouth daily.   Yes [provider]  Bromfenac Sodium (PROLENSA) 0.07 % SOLN Apply to eye.    [provider]  cephALEXin (KEFLEX) 500 MG capsule Take 1 capsule (500 mg total) by mouth 2 (two) times daily. 04/06/20   Payton Mccallum, MD  fluticasone (FLONASE) 50 MCG/ACT nasal spray Place into both nostrils daily.    [provider]  omega-3 acid ethyl esters  (LOVAZA) 1 g capsule Take by mouth 2 (two) times daily.    [provider]  pyridoxine (B-6) 100 MG tablet Take 100 mg by mouth daily.    [provider]  zinc gluconate 50 MG tablet Take 50 mg by mouth daily.    [provider]    Family History Family History  Problem Relation Age of Onset  . Emphysema Father   . Other Father        suicide  . Other Mother        "old age"  . Kidney cancer Neg Hx   . Prostate cancer Neg Hx     Social History Social History   Tobacco Use  . Smoking status: Never Smoker  . Smokeless tobacco: Never Used  Vaping Use  . Vaping Use: Never used  Substance Use Topics  . Alcohol use: No  . Drug use: No     Allergies   Hydrocodone-acetaminophen, Rosuvastatin, Vicodin [hydrocodone-acetaminophen], and Ibuprofen   Review of Systems Review of Systems   Physical Exam Triage Vital Signs ED Triage Vitals  Enc Vitals Group     BP 04/06/20 1934 126/77     Pulse Rate 04/06/20 1934 77     Resp 04/06/20 1934 18     Temp 04/06/20 1934 97.8 F (36.6 C)     Temp Source 04/06/20 1934 Oral     SpO2 04/06/20 1934 99 %     Weight 04/06/20 1935 190 lb (86.2 kg)     Height 04/06/20 1935 5\' 8"  (1.727 m)     Head Circumference --      Peak Flow --      Pain Score 04/06/20 1934 8     Pain Loc --      Pain Edu? --      Excl. in GC? --    No data found.  Updated Vital Signs BP 126/77 (BP Location: Right Arm)   Pulse 77   Temp 97.8 F (36.6 C) (Oral)   Resp 18   Ht 5\' 8"  (1.727 m)   Wt 86.2 kg   SpO2 99%   BMI 28.89 kg/m   Visual Acuity Right Eye Distance:   Left Eye Distance:   Bilateral Distance:    Right Eye Near:   Left Eye Near:    Bilateral Near:     Physical Exam Vitals and nursing note reviewed.  Constitutional:      General: He is not in acute distress.    Appearance: He is not toxic-appearing or diaphoretic.  Musculoskeletal:     Right foot: Normal range of motion and normal capillary refill.  Swelling and tenderness present. No deformity, bunion, Charcot foot, foot drop, prominent metatarsal heads, laceration, bony tenderness or crepitus. Normal pulse.  Comments: Right lower extremity neurovascularly intact; pinpoint puncture wound noted on anterior ankle skin area with surrounding blanchable erythema, warmth, edema and tenderness to palpation; 3 blister lesions noted as well  Neurological:     Mental Status: He is alert.      UC Treatments / Results  Labs (all labs ordered are listed, but only abnormal results are displayed) Labs Reviewed - No data to display  EKG   Radiology No results found.  Procedures Procedures (including critical care time)  Medications Ordered in UC Medications - No data to display  Initial Impression / Assessment and Plan / UC Course  I have reviewed the triage vital signs and the nursing notes.  Pertinent labs & imaging results that were available during my care of the patient were reviewed by me and considered in my medical decision making (see chart for details).      Final Clinical Impressions(s) / UC Diagnoses   Final diagnoses:  Cellulitis of right lower extremity  Insect bite of right foot, initial encounter     Discharge Instructions     Warm compresses, elevate Notify primary care provider regarding checking INR (coumadin)    ED Prescriptions    Medication Sig Dispense Auth. Provider   cephALEXin (KEFLEX) 500 MG capsule Take 1 capsule (500 mg total) by mouth 2 (two) times daily. 40 capsule Norval Gable, MD     1. diagnosis reviewed with patient 2. rx as per orders above; reviewed possible side effects, interactions, risks and benefits  3. Recommend supportive treatment as above 4. Follow-up prn if symptoms worsen or don't improve  PDMP not reviewed this encounter.   Norval Gable, MD 04/06/20 2039

## 2020-04-06 NOTE — ED Triage Notes (Signed)
Patient in today c/o insect bites to his right ankle 2 days ago. Patient was working in the yard. Patient states the areas started blistering yesterday. Patient used OTC Benadryl cream without relief.

## 2020-04-06 NOTE — Discharge Instructions (Signed)
Warm compresses, elevate Notify primary care provider regarding checking INR (coumadin)

## 2022-11-03 ENCOUNTER — Encounter: Payer: Self-pay | Admitting: Emergency Medicine

## 2022-11-03 ENCOUNTER — Ambulatory Visit
Admission: EM | Admit: 2022-11-03 | Discharge: 2022-11-03 | Disposition: A | Discharge status: Home / Self Care | Payer: Medicare PPO | Attending: Emergency Medicine | Admitting: Emergency Medicine

## 2022-11-03 ENCOUNTER — Ambulatory Visit (INDEPENDENT_AMBULATORY_CARE_PROVIDER_SITE_OTHER): Payer: Medicare PPO

## 2022-11-03 DIAGNOSIS — Z1152 Encounter for screening for COVID-19: Secondary | ICD-10-CM | POA: Insufficient documentation

## 2022-11-03 DIAGNOSIS — M47814 Spondylosis without myelopathy or radiculopathy, thoracic region: Secondary | ICD-10-CM | POA: Insufficient documentation

## 2022-11-03 DIAGNOSIS — Z950 Presence of cardiac pacemaker: Secondary | ICD-10-CM | POA: Diagnosis not present

## 2022-11-03 DIAGNOSIS — R059 Cough, unspecified: Secondary | ICD-10-CM | POA: Diagnosis not present

## 2022-11-03 DIAGNOSIS — J09X2 Influenza due to identified novel influenza A virus with other respiratory manifestations: Secondary | ICD-10-CM

## 2022-11-03 DIAGNOSIS — I129 Hypertensive chronic kidney disease with stage 1 through stage 4 chronic kidney disease, or unspecified chronic kidney disease: Secondary | ICD-10-CM | POA: Diagnosis not present

## 2022-11-03 DIAGNOSIS — J101 Influenza due to other identified influenza virus with other respiratory manifestations: Secondary | ICD-10-CM | POA: Diagnosis not present

## 2022-11-03 DIAGNOSIS — E785 Hyperlipidemia, unspecified: Secondary | ICD-10-CM | POA: Insufficient documentation

## 2022-11-03 DIAGNOSIS — I083 Combined rheumatic disorders of mitral, aortic and tricuspid valves: Secondary | ICD-10-CM | POA: Insufficient documentation

## 2022-11-03 DIAGNOSIS — F32A Depression, unspecified: Secondary | ICD-10-CM | POA: Diagnosis not present

## 2022-11-03 DIAGNOSIS — Z952 Presence of prosthetic heart valve: Secondary | ICD-10-CM | POA: Diagnosis not present

## 2022-11-03 DIAGNOSIS — R058 Other specified cough: Secondary | ICD-10-CM | POA: Diagnosis not present

## 2022-11-03 DIAGNOSIS — Z8673 Personal history of transient ischemic attack (TIA), and cerebral infarction without residual deficits: Secondary | ICD-10-CM | POA: Insufficient documentation

## 2022-11-03 DIAGNOSIS — G40209 Localization-related (focal) (partial) symptomatic epilepsy and epileptic syndromes with complex partial seizures, not intractable, without status epilepticus: Secondary | ICD-10-CM | POA: Insufficient documentation

## 2022-11-03 LAB — RESP PANEL BY RT-PCR (RSV, FLU A&B, COVID)  RVPGX2
Influenza A by PCR: POSITIVE — AB
Influenza B by PCR: NEGATIVE
Resp Syncytial Virus by PCR: NEGATIVE
SARS Coronavirus 2 by RT PCR: NEGATIVE

## 2022-11-03 MED ORDER — PROMETHAZINE-DM 6.25-15 MG/5ML PO SYRP: 5.0000 mL | ORAL_SOLUTION | Freq: Four times a day (QID) | ORAL | 0 refills | Status: AC | PRN

## 2022-11-03 MED ORDER — OSELTAMIVIR PHOSPHATE 75 MG PO CAPS: 75.0000 mg | ORAL_CAPSULE | Freq: Two times a day (BID) | ORAL | 0 refills | Status: AC

## 2022-11-03 MED ORDER — BENZONATATE 100 MG PO CAPS: 200.0000 mg | ORAL_CAPSULE | Freq: Three times a day (TID) | ORAL | 0 refills | Status: AC

## 2022-11-03 NOTE — ED Triage Notes (Signed)
Patient presents with a cough and fatigue x 2 days.

## 2022-11-03 NOTE — Discharge Instructions (Addendum)
Take the Tamiflu twice daily for 5 days for treatment of influenza.  Use over-the-counter Delsym, Zarbee's, or Robitussin during the day as needed for cough.  Use the Tessalon Perles every 8 hours as needed for cough.  Taken with a small sip of water.  You may experience some numbness to your tongue or metallic taste in her mouth, this is normal.  Use the Promethazine DM cough syrup at bedtime as will make you drowsy but it should help dry up your postnasal drip and aid you in sleep and cough relief.  Return for reevaluation, or see your primary care provider, for new or worsening symptoms.

## 2022-11-03 NOTE — ED Provider Notes (Signed)
[facility]    CSN: [id number] Arrival date & time: 11/03/22  4098      History   Chief Complaint Chief Complaint  Patient presents with   Cough   Fatigue    HPI Johnny [name] is a 72 y.o. male.   HPI  72 year old male here for evaluation of cough.  The patient reports that he began feeling ill 2 days ago and developed a cough that is intermittently productive for sputum but is mostly dry.  This is not associated with any fever, runny nose, nasal congestion, ear pain, chest pain, shortness of breath, wheezing, or leg swelling.  He does endorse that he feels fatigued and states that he does have a sore throat but states that it is from coughing.  The patient's past medical history is significant for cerebellar stroke, depression, hypertension, hyperlipidemia, mitral valve regurgitation, aortic valve replacement, renal insufficiency, endocarditis, complex partial seizures, and kidney failure.  Past Medical History:  Diagnosis Date   Arthritis    Cerebellar stroke ([place])    Complex partial seizures ([place])    Depression    Diplopia    Dysrhythmia    PVC   Endocarditis    Hyperlipidemia    Hypertension    Kidney failure    Leukocytosis    Mitral valve regurgitation    Osteoarthritis of both knees    Presence of permanent cardiac pacemaker    Prosthetic aortic valve stenosis    Pseudophakia of both eyes    Renal insufficiency     Patient Active Problem List   Diagnosis Date Noted   Stroke ([place]) 09/23/2016   Pseudophakia, both eyes 09/23/2016   Diplopia 09/23/2016   Arthritis 09/23/2016   Bacterial endocarditis 09/23/2016   CKD (chronic kidney disease) stage 3, GFR 30-59 ml/min (HCC) 03/21/2016   Primary osteoarthritis of both knees 01/05/2015   Valvular heart disease 12/06/2014   Essential hypertension 11/16/2014   Intermittent alternating exotropia 10/23/2014   Complex partial seizure ([place]) 11/11/2013   History of cerebellar stroke 11/10/2013    Aortic valve replaced 01/13/2012   Aortic insufficiency and aortic stenosis 07/31/2011   Mild tricuspid regurgitation 07/31/2011   Mild mitral regurgitation 07/31/2011   HLD (hyperlipidemia) 07/31/2011   Depression 07/31/2011    Past Surgical History:  Procedure Laterality Date   AORTIC VALVE REPLACEMENT  11/04/2011   CARDIAC VALVE REPLACEMENT  2013   COLONOSCOPY     COLONOSCOPY WITH PROPOFOL N/A 11/29/2018   Procedure: COLONOSCOPY WITH PROPOFOL;  Surgeon: Manya Silvas, MD;  Location: [facility] ENDOSCOPY;  Service: Endoscopy;  Laterality: N/A;   EYE SURGERY     HEMORRHOID SURGERY     SIMPLE LIGATION    INSERT / REPLACE / REMOVE PACEMAKER     lens eye surgery   06/2013   REMOVAL SECONDARY MEMBRANOUS CATARACT   09/27/2013   REPAIR RETINAL DETACHMENT W/SCLERAL BUCKLE  12/22/2012   TONSILLECTOMY         Home Medications    Prior to Admission medications   Medication Sig Start Date End Date Taking? Authorizing Provider  benzonatate (TESSALON) 100 MG capsule Take 2 capsules (200 mg total) by mouth every 8 (eight) hours. 11/03/22  Yes Margarette Canada, NP  oseltamivir (TAMIFLU) 75 MG capsule Take 1 capsule (75 mg total) by mouth every 12 (twelve) hours. 11/03/22  Yes Margarette Canada, NP  promethazine-dextromethorphan (PROMETHAZINE-DM) 6.25-15 MG/5ML syrup Take 5 mLs by mouth 4 (four) times daily as needed. 11/03/22  Yes Margarette Canada, NP  acetaminophen (TYLENOL) 325 MG tablet Take 650 mg by mouth every 6 (six) hours as needed.    [provider]  amoxicillin (AMOXIL) 500 MG capsule Take 500 mg by mouth once.    [provider]  aspirin 81 MG tablet Take 81 mg by mouth daily.    [provider]  atorvastatin (LIPITOR) 10 MG tablet Take 10 mg by mouth daily.    [provider]  Bromfenac Sodium (PROLENSA) 0.07 % SOLN Apply to eye.    [provider]  cephALEXin (KEFLEX) 500 MG capsule Take 1 capsule (500 mg total) by mouth 2 (two) times daily. 04/06/20   Payton Mccallum, MD  diphenhydrAMINE (BENADRYL) 25 mg capsule Take 25 mg by mouth every 6 (six) hours as needed.    [provider]  fluticasone (FLONASE) 50 MCG/ACT nasal spray Place into both nostrils daily.    [provider]  lamoTRIgine (LAMICTAL) 150 MG tablet Take 150 mg by mouth daily.    [provider]  lisinopril (PRINIVIL,ZESTRIL) 10 MG tablet Take 10 mg by mouth daily.    [provider]  Melatonin 10 MG TABS Take 1 tablet by mouth at bedtime.    [provider]  metoprolol succinate (TOPROL-XL) 25 MG 24 hr tablet Take 25 mg by mouth daily.    [provider]  Multiple Vitamin (MULTIVITAMIN) tablet Take 1 tablet by mouth daily.    [provider]  omega-3 acid ethyl esters (LOVAZA) 1 g capsule Take by mouth 2 (two) times daily.    [provider]  pyridoxine (B-6) 100 MG tablet Take 100 mg by mouth daily.    [provider]  warfarin (COUMADIN) 3 MG tablet Take 3 mg by mouth daily.    [provider]  zinc gluconate 50 MG tablet Take 50 mg by mouth daily.    [provider]    Family History Family History  Problem Relation Age of Onset   Emphysema Father    Other Father        suicide   Other Mother        "old age"   Kidney cancer Neg Hx    Prostate cancer Neg Hx     Social History Social History   Tobacco Use   Smoking status: Never   Smokeless tobacco: Never  Vaping Use   Vaping Use: Never used  Substance Use Topics   Alcohol use: No   Drug use: No     Allergies   Hydrocodone-acetaminophen, Rosuvastatin, Vicodin [hydrocodone-acetaminophen], and Ibuprofen   Review of Systems Review of Systems  Constitutional:  Positive for fatigue. Negative for fever.  HENT:  Positive for sore throat. Negative for congestion, ear pain and rhinorrhea.   Respiratory:  Positive for cough. Negative for shortness of breath and wheezing.   Cardiovascular:  Negative for chest pain,  palpitations and leg swelling.     Physical Exam Triage Vital Signs ED Triage Vitals  Enc Vitals Group     BP 11/03/22 1000 105/70     Pulse Rate 11/03/22 1000 77     Resp 11/03/22 1000 18     Temp 11/03/22 1000 98 F (36.7 C)     Temp Source 11/03/22 1000 Oral     SpO2 11/03/22 1000 98 %     Weight --      Height --      Head Circumference --      Peak Flow --  Pain Score 11/03/22 0958 0     Pain Loc --      Pain Edu? --      Excl. in GC? --    No data found.  Updated Vital Signs BP 105/70 (BP Location: Left Arm)   Pulse 77   Temp 98 F (36.7 C) (Oral)   Resp 18   SpO2 98%   Visual Acuity Right Eye Distance:   Left Eye Distance:   Bilateral Distance:    Right Eye Near:   Left Eye Near:    Bilateral Near:     Physical Exam Vitals and nursing note reviewed.  Constitutional:      Appearance: Normal appearance. He is not ill-appearing.  HENT:     Head: Normocephalic and atraumatic.     Right Ear: Tympanic membrane, ear canal and external ear normal. There is no impacted cerumen.     Left Ear: Tympanic membrane, ear canal and external ear normal. There is no impacted cerumen.     Nose: Nose normal. No congestion or rhinorrhea.     Mouth/Throat:     Mouth: Mucous membranes are moist.     Pharynx: Oropharynx is clear. No oropharyngeal exudate or posterior oropharyngeal erythema.  Cardiovascular:     Rate and Rhythm: Normal rate and regular rhythm.     Pulses: Normal pulses.     Heart sounds: Normal heart sounds. No murmur heard.    No friction rub. No gallop.  Pulmonary:     Effort: Pulmonary effort is normal.     Breath sounds: Normal breath sounds. No wheezing, rhonchi or rales.  Musculoskeletal:     Cervical back: Neck supple.     Right lower leg: No edema.     Left lower leg: No edema.  Lymphadenopathy:     Cervical: No cervical adenopathy.  Skin:    General: Skin is warm and dry.     Capillary Refill: Capillary refill takes less than 2  seconds.     Findings: No erythema or rash.  Neurological:     General: No focal deficit present.     Mental Status: He is alert and oriented to person, place, and time.  Psychiatric:        Mood and Affect: Mood normal.        Behavior: Behavior normal.        Thought Content: Thought content normal.        Judgment: Judgment normal.      UC Treatments / Results  Labs (all labs ordered are listed, but only abnormal results are displayed) Labs Reviewed  RESP PANEL BY RT-PCR (RSV, FLU A&B, COVID)  RVPGX2 - Abnormal; Notable for the following components:      Result Value   Influenza A by PCR POSITIVE (*)    All other components within normal limits    EKG   Radiology DG Chest 2 View  Result Date: 11/03/2022 CLINICAL DATA:  Cough for 2 days.  Intermittently productive. EXAM: CHEST - 2 VIEW COMPARISON:  AP chest 02/04/2012 FINDINGS: Left chest wall cardiac pacer with leads overlying the right atrium, right ventricle, and coronary sinus, new from prior. No median sternotomy. A prosthetic heart valve is noted. Additional cerclage wires overlying the medial right hemithorax are unchanged from 02/04/2012. Cardiac silhouette and mediastinal contours within normal limits. Moderate calcification within the aortic arch. The lungs are clear. No pleural effusion pneumothorax. Moderate multilevel degenerative disc changes of the thoracic spine. IMPRESSION: Compared to remote 02/04/2012 radiograph:  1. No acute cardiopulmonary disease. 2. Interval placement of a left chest wall cardiac pacer and cardiac valve prosthesis. Electronically Signed   By: Neita Garnet M.D.   On: 11/03/2022 11:16    Procedures Procedures (including critical care time)  Medications Ordered in UC Medications - No data to display  Initial Impression / Assessment and Plan / UC Course  I have reviewed the triage vital signs and the nursing notes.  Pertinent labs & imaging results that were available during my care of the  patient were reviewed by me and considered in my medical decision making (see chart for details).   Patient is a nontoxic-appearing 72 year old male here for evaluation of 2 days worth of cough and fatigue as outlined in HPI above.  Patient does have a significant cardiovascular history but he is not having any chest pain or leg swelling.  His physical exam does not reveal any inflammation of his upper respiratory tract and his lungs are clear to auscultation all fields.  Given his significant history a respiratory panel was collected at triage.  I will order a chest x-ray because the patient does endorse that he will intermittently produce a large volume of sputum but for the most part he has a nonproductive cough.  He is neither dyspneic nor tachypneic.  Heart sounds are S1-S2 and is the presence of mechanical valve.  Patient's respiratory panel is positive for influenza A.  Radiology impression of chest x-ray states no active cardiopulmonary disease when compared to remote chest radiograph from 02/04/2012.  I will discharge patient home with a diagnosis of influenza A with a prescription for Tamiflu 75 mg twice daily for 5 days.  I have also prescribed Tessalon Perles and Promethazine DM cough syrup for cough control.  Return precautions reviewed.   Final Clinical Impressions(s) / UC Diagnoses   Final diagnoses:  Influenza due to identified novel influenza A virus with other respiratory manifestations     Discharge Instructions      Take the Tamiflu twice daily for 5 days for treatment of influenza.  Use over-the-counter Delsym, Zarbee's, or Robitussin during the day as needed for cough.  Use the Tessalon Perles every 8 hours as needed for cough.  Taken with a small sip of water.  You may experience some numbness to your tongue or metallic taste in her mouth, this is normal.  Use the Promethazine DM cough syrup at bedtime as will make you drowsy but it should help dry up your postnasal  drip and aid you in sleep and cough relief.  Return for reevaluation, or see your primary care provider, for new or worsening symptoms.      ED Prescriptions     Medication Sig Dispense Auth. Provider   oseltamivir (TAMIFLU) 75 MG capsule Take 1 capsule (75 mg total) by mouth every 12 (twelve) hours. 10 capsule Becky Augusta, NP   benzonatate (TESSALON) 100 MG capsule Take 2 capsules (200 mg total) by mouth every 8 (eight) hours. 21 capsule Becky Augusta, NP   promethazine-dextromethorphan (PROMETHAZINE-DM) 6.25-15 MG/5ML syrup Take 5 mLs by mouth 4 (four) times daily as needed. 118 mL Becky Augusta, NP      PDMP not reviewed this encounter.   Becky Augusta, NP 11/03/22 1120

## 2023-05-05 NOTE — Progress Notes (Signed)
 Chief Complaint  Patient presents with  . Left Index Finger - Edema, Pain    Catching     History of the Present Illness: Johnny [name] is a 72 y.o. male here today.   The patient is a 72 year old male who comes in with a chief complaint of left index finger pain. He has a history of prior total joints with Ozell Lace, MD at [facility].  The patient, who is right-hand dominant, reports difficulty in making a fist with his left index finger. He has left index finger locking. This has been occurring for approximately 15 weeks.   The patient reports his knee is doing well. He is 15 weeks out from surgery and can play pickleball and ride his bicycle again. He has left index finger locking.   I have reviewed past medical, surgical, social and family history, and allergies as documented in the EMR.   Past Medical History: Past Medical History:  Diagnosis Date  . Anemia, unspecified   . Anesthesia complication    Post op delirium with PM and AVR  . Anticoagulated on warfarin 10/22/2018  . Aortic valve disorders   . Arthritis   . Asthma without status asthmaticus (HHS-HCC)    as a child  . Atrial fibrillation (CMS/HHS-HCC) 07/2013   discovered by neurologist  . Bacterial endocarditis (HHS-HCC)    h/o  . Biventricular cardiac pacemaker in situ 01/05/2018  . CKD (chronic kidney disease) stage 3, GFR 30-59 ml/min (CMS/HHS-HCC) 03/21/2016  . Complete heart block, post-surgical (CMS/HHS-HCC) 11/05/2017  . Decreased libido   . Depression 07/31/2011  . Diplopia 11/27/2014  . Disorders of bursae and tendons in shoulder region, unspecified   . Fitting or adjustment of cardiac pacemaker 05/26/2018  . History of cataract 8 Aug. 2014   had surgery to correct in right eye  . History of colonic polyps   . HLD (hyperlipidemia) 07/31/2011  . Hypertension    now under control: 123 over 83  . Mild mitral regurgitation 07/31/2011  . Mild tricuspid regurgitation 07/31/2011  . Moderate  aortic insufficiency and moderate aortic stenosis 07/31/2011  . OSA (obstructive sleep apnea)   . Osteoarthritis   . Other and unspecified hyperlipidemia   . Paroxysmal atrial fibrillation (CMS/HHS-HCC) 03/22/2018  . Plantar fascial fibromatosis   . Pseudophakia, both eyes   . PVC (premature ventricular contraction) 06/03/2018  . Retinal detachment Feb. 2014   twice had surgery to correct in right eye  . Seizures (CMS/HHS-HCC) 01/2013   Another in April 2015...allergy related?  . Strabismus   . Stroke (CMS/HHS-HCC)   . Vision abnormalities     Past Surgical History: Past Surgical History:  Procedure Laterality Date  . FLEXIBLE SIGMOIDOSCOPY  06/17/2000   Small Polyp @ 12cm noted  . COLONOSCOPY  02/09/2007   PH Adenomatous Polyps  . CARDIAC VALVE REPLACEMENT  2013  . 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 COLONOSCOPY  09/13/2012   Adenomatous Polyp: CBF 08/2017; Recall Ltr mailed 07/23/2017 (dw)  . REPAIR RETINAL DETACHMENT W/SCLERAL BUCKLE Right 12/22/2012   Dr. Eugenia s/p PPV, SB,ED, Gas (C3F8)  . LENS EYE SURGERY Bilateral 06/2013   Dr. Velinda Raw  . REMOVAL SECONDARY MEMBRANOUS CATARACT Right 09/27/2013   Dr. Velinda Raw  . REPLACEMENT AORTIC VALVE N/A 11/03/2017   Procedure: Redo ADULT, REPLACEMENT, AORTIC VALVE, OPEN, WITH CARDIOPULMONARY BYPASS, STERNOTOMY; WITH PROSTHETIC VALVE OTHER THAN HOMOGRAFT OR STENTLESS VALVE, s/p avr, right thoracotomy '13;  Surgeon: Ricky Reyes Prude, MD;  Location: [facility];  Service: Cardiothoracic;  Laterality: N/A;  . COLONOSCOPY  11/29/2018   PH Adenomatous Polyps: CBF 11/2023  . ARTHROPLASTY TOTAL KNEE Left 01/19/2023   Procedure: ARTHROPLASTY, KNEE, CONDYLE AND PLATEAU; MEDIAL AND LATERAL COMPARTMENTSWITH OR WITHOUT PATELLA RESURFACING (TOTAL KNEE ARTHROPLASTY);  Surgeon: Sande Ozell Mt, MD;  Location: [facility] OR;  Service: Orthopedics;  Laterality: Left;  . COLONOSCOPY  11/02/2002, 08/21/2000   Adenomatous Polyp  .  HEMORRHOIDECTOMY BY SIMPLE LIGATION    . INSERT / REPLACE / REMOVE PACEMAKER    . TONSILLECTOMY      Past Family History: Family History  Problem Relation Age of Onset  . Hyperlipidemia (Elevated cholesterol) Mother   . No Known Problems Father   . Glaucoma Neg Hx   . Macular degeneration Neg Hx   . Anesthesia problems Neg Hx     Medications: Current Outpatient Medications  Medication Sig Dispense Refill  . amoxicillin (AMOXIL) 500 MG tablet Take 4 tablets equal to 2 g 1 hour prior to dental visits or other procedures likely cause bacteremia. 22 tablet 2  . atorvastatin (LIPITOR) 10 MG tablet TAKE 1 TABLET EVERY MONDAY, WEDNESDAY, FRIDAY, AND SUNDAY ONLY 48 tablet 0  . lamoTRIgine (LAMICTAL) 200 MG tablet Take 1 tablet (200 mg total) by mouth 2 (two) times daily for 90 days 180 tablet 1  . lisinopriL (ZESTRIL) 20 MG tablet Take 1 tablet by mouth once daily 90 tablet 3  . metoprolol succinate (TOPROL-XL) 25 MG XL tablet Take 1 tablet (25 mg total) by mouth 2 (two) times daily 180 tablet 3  . multivitamin capsule 1 cap by mouth daily    . warfarin (COUMADIN) 4 MG tablet Take 2 tablets, 8mg , everyday 180 tablet 0  . tadalafiL (CIALIS) 20 MG tablet Take 1 tablet (20 mg total) by mouth once daily as needed for Erectile Dysfunction for up to 30 days 10 tablet 2   No current facility-administered medications for this visit.    Allergies: Allergies  Allergen Reactions  . Crestor [Rosuvastatin] Other (See Comments) and Muscle Pain    depression  . Ibuprofen Nausea  . Vicodin [Hydrocodone-Acetaminophen ] Nausea     Body mass index is 26.73 kg/m.   Review of Systems: A comprehensive 14 point ROS was performed, reviewed, and the pertinent orthopaedic findings are documented in the HPI.   Vitals:   05/05/23 0825  BP: 124/72       General Physical Examination:   General/Constitutional: No apparent distress: well-nourished and well developed. Eyes: Pupils equal, round with  synchronous movement. Lungs: Clear to auscultation HEENT: Normal Vascular: No edema, swelling or tenderness, except as noted in detailed exam. Cardiac: Heart rate and rhythm is regular. Integumentary: No impressive skin lesions present, except as noted in detailed exam. Neuro/Psych: Normal mood and affect, oriented to person, place, and time. Musculoskeletal: Unable to make a composite fist. Range of motion is flexion the left index finger to about 20 degrees at the MCP and at the IP joints as well. He does have motion. No evidence of tendon rupture. He is very tender at the A1 pulley.    Radiographs:  No new imaging studies were obtained or reviewed today.   Assessment:   ICD-10-CM  1. Acquired trigger finger of left index finger  M65.322     Plan:  The patient has clinical findings of left index trigger finger.  A comprehensive discussion was held with the patient regarding trigger finger and trigger finger release. I recommend a left index trigger finger  injection. If the injection fails to provide relief, the patient will send us  a message regarding trigger finger release.   The patient will follow up as needed.   Trigger Finger Injection: The skin of the left index finger was first prepped with chlorhexidine inferolaterally. Ethyl Chloride spray was then applied. A 25-gauge needle was inserted and 1 cc Betamethasone and 1 cc Marcaine injected. The patient tolerated this well. A Band-Aid was applied.  Document Attestation: I, Dawn Royse, have reviewed and updated documentation for [facility], MD, utilizing [employer] DAX.

## 2023-05-12 ENCOUNTER — Other Ambulatory Visit: Payer: Self-pay | Admitting: Student

## 2023-05-12 DIAGNOSIS — G40219 Localization-related (focal) (partial) symptomatic epilepsy and epileptic syndromes with complex partial seizures, intractable, without status epilepticus: Secondary | ICD-10-CM

## 2023-05-19 ENCOUNTER — Encounter: Payer: Self-pay | Admitting: Student

## 2023-05-22 ENCOUNTER — Ambulatory Visit
Admission: RE | Admit: 2023-05-22 | Discharge: 2023-05-22 | Disposition: A | Discharge status: Home / Self Care | Payer: Medicare PPO | Source: Ambulatory Visit | Attending: Student | Admitting: Student

## 2023-05-22 DIAGNOSIS — G40219 Localization-related (focal) (partial) symptomatic epilepsy and epileptic syndromes with complex partial seizures, intractable, without status epilepticus: Secondary | ICD-10-CM

## 2024-03-10 NOTE — Progress Notes (Signed)
 Primary Electrophysiologist: Alyce File, MD  Primary Care Provider: Sadie Tamra Cal, MD [address] [place] [address]   Patient Identification: Johnny [name] is a 73 y.o. male with history re-do AVR with a mechanical aortic valve on 11/03/17, complete heart block s/p CRT-P on 11/09/17 with generator change 01/04/2024, CVA, CKD, PVCs, and hypertension.  Last EP OV with Dorn Kotyk, NP on 08/20/2023. Seen for post-op follow-up 01/25/2024  Interval History: History of Present Illness Johnny [name], a 73 y.o. male, is presenting today for routine follow up and in-office device interrogation. Since his last office visit, notes doing well overall. No issues with the pocket site  Pt is accompanied by his wife.  Pt get INR monitoring in [place] with Dr. Sadie - no changes to coumadin - last INR 2.4 (03/08/2024)  Pt notices some lightheadedness intermittently - does not happen often - this was mentioned in his OV with [place] NP from 02/13/2023. Can happen at rest or with activity. Pt maintains moderately active lifestyle with multiple days per week of pickle ball. Reports that today it happened during pickle ball. Pt reports feeling off balance or weakness denies any pre-syncope or syncope. Will sit down. Reports feels better in 2-3 minutes. Reports most recent episode he was back up and playing pickle ball in 2-3 minutes. Denies any SOB or palpitations with episodes. Reports that he has been experiencing these for a long time.   Pertinent details of the medical, social and family history were reviewed and are unchanged except as noted.  ROS: Review of Systems  Constitutional: Negative.   Respiratory: Negative.    Cardiovascular: Negative.   Neurological:  Positive for dizziness.       Review of systems otherwise negative except as indicated above in the history.  Problem List:  Patient Active Problem List  Diagnosis  . Aortic valve  stenosis with insufficiency  . Mild mitral regurgitation  . Mild tricuspid regurgitation  . HLD (hyperlipidemia)  . Depression  . Bacterial endocarditis (HHS-HCC)  . History of cerebellar stroke  . Complex partial seizure (CMS/HHS-HCC)  . Intermittent alternating exotropia  . Essential hypertension  . Diplopia  . Pseudophakia, both eyes  . Primary osteoarthritis of both knees  . Stroke (CMS/HHS-HCC)  . Arthritis  . Stage 3 chronic kidney disease  . Episodic weakness  . S/P AVR (redo)  . Complete heart block, post-surgical (CMS/HHS-HCC)  . Leukocytosis  . Thrombocytopenia ()  . S/P AVR (aortic valve replacement)  . Biventricular cardiac pacemaker in situ: Medtronic  . Paroxysmal atrial fibrillation (CMS/HHS-HCC)  . PVC (premature ventricular contraction)  . Chronic anticoagulation: Warfarin  . Benign hypertensive kidney disease with chronic kidney disease stage I through stage IV, or unspecified  . Anemia in chronic kidney disease  . Nephrolithiasis  . OSA (obstructive sleep apnea)  . Elevated INR  . Nonrheumatic aortic insufficiency with aortic stenosis  . Seizure disorder (CMS/HHS-HCC)  . Primary osteoarthritis of left knee  . Preoperative evaluation of a medical condition to rule out surgical contraindications (TAR required)  . History of total knee arthroplasty, left  . Pacemaker at end of battery life     Medications: Current Outpatient Medications  Medication Sig Dispense Refill  . amoxicillin (AMOXIL) 500 MG tablet Take 4 tablets equal to 2 g 1 hour prior to dental visits or other procedures likely cause bacteremia. 22 tablet 2  . atorvastatin (LIPITOR) 10 MG tablet TAKE 1 TABLET EVERY MONDAY, WEDNESDAY, FRIDAY, AND  SUNDAY ONLY 48 tablet 0  . lamoTRIgine (LAMICTAL) 100 MG tablet Take 1 tablet (100 mg total) by mouth at bedtime with 200 mg for total of 300 mg nightly 90 tablet 3  . lamoTRIgine (LAMICTAL) 200 MG tablet Take 1 tablet (200 mg total) by mouth every 12  (twelve) hours for 180 days 180 tablet 1  . lisinopriL (ZESTRIL) 20 MG tablet Take 1 tablet by mouth once daily 90 tablet 3  . metoprolol succinate (TOPROL-XL) 25 MG XL tablet Take 1 tablet by mouth twice daily 180 tablet 3  . multivitamin capsule 1 cap by mouth daily    . warfarin (COUMADIN) 4 MG tablet Take 2 tablets (8 mg total) by mouth at bedtime 180 tablet 0   No current facility-administered medications for this visit.    An updated, reconciled list of all medications, including OTC and herbal, was reviewed and a copy of the updated list was provided to the patient. Treatment plan and medications were discussed with the patient who voiced understanding. No barriers to learning.  Physical Exam:  Vitals: BP 126/71 (BP Location: Left upper arm, Patient Position: Sitting, BP Cuff Size: Adult)   Pulse 80   Resp 18   Ht 172.7 cm (5' 8)   Wt 85.5 kg (188 lb 6.4 oz)   SpO2 97%   BMI 28.65 kg/m  There is no height or weight on file to calculate BMI.  General Appearance:  Appears well, no distress; Alert and oriented x 3  Lungs:   clear to auscultation, without rales or wheeze, good air exchange  Heart:  normal rate and regular rhythm, no murmurs noted, midsystolic click present.   Extremities: Extremities normal, no cyanosis. no edema    Skin: No rashes or ulcers. Device located at the Left infraclavicular fossa. No erosion, thinning. Incision intact.   Neurologic: Alert, interactive, and appropriate, grossly moving all 4 extremities    ECG: Results for orders placed or performed in visit on 03/11/24  ECG 12-lead   Collection Time: 03/11/24  2:32 PM  Result Value Ref Range   Vent Rate (bpm) 81    PR Interval (msec) 184    QRS Interval (msec) 140    QT Interval (msec) 404    QTc (msec) 469     Device 11/09/17: Implant Placement: Left infraclavicular subcutaneous MEDTRONIC CRM / PACEMAKER, PERCEPTA MRI QUAD CRT-P / [id number] S/N: [id number] H  Implanted - Acute MEDTRONIC  CRM LEAD, PACE NOVUS CAPSUREFIX 5076 [id number] [id number] S/N: [id number] Lead type: New Lead Location: low RV septum  Implanted - Acute MEDTRONIC CRM LEAD, PERFORMA LAV STRAIGHT 88CM [id number] S/N: [id number] V Lead type: New Lead Location: CS lateral branch  Implanted - Acute MEDTRONIC CRM LEAD, PACE NOVUS CAPSUREFIX 5076 [id number] [id number] S/N: [id number] Lead type: New Lead Location: RA appendage   Device interrogation: Please see EP CIED procedure note for detail of device interrogation and programming.  Assessment/plan:  Biventricular Pacemaker: Medtronic Remote Transmissions: Received and reviewed In-office interrogation: Stable device function / ERI: 7.6 years / Effective VP: 99.7% / AP 17.3%/ No events / No programming changes Continue remote monitoring q3 months.  Follow up with EP 6 months   Complete Heart Block Developed CHB post-operative after mechanical AVR in 2019 No acute symptoms  Paroxysmal Atrial Fibrillation No events on today's interrogation Continue: Toprol XL Continue coumadin   Chronic Anticoagulation: Warfarin CHADS2-Vasc Score: Age / 36-74 (1), Sex / Male (0), Hypertension History (1), and Stroke / TIA / Thromboembolism History (2) INR Monitoring  per PCP Indications: Mechanical AVR / PAF No reports of significant bruising or bleeding Continue Warfarin and INR monitoring  Lightheadedness No abnormal findings on exam or device interrogation Reviewed importance of BP monitoring at home Encouraged better hydration, especially with physical activity Advised pt to discuss symptoms with neurologist and Dr. Margrette (cardiologist) Assessment & Plan   I spent a total of 40 minutes in both face-to-face and non-face-to-face activities for this visit on the date of this encounter.    Attestation Statement:   I personally performed the service, non-incident to. [facility])   Jeoffrey Ano, NP This note has been created using automated tools and reviewed for accuracy  by [employer].

## 2024-07-16 ENCOUNTER — Emergency Department

## 2024-07-16 ENCOUNTER — Emergency Department
Admission: EM | Admit: 2024-07-16 | Discharge: 2024-07-16 | Disposition: A | Discharge status: Home / Self Care | Attending: Emergency Medicine | Admitting: Emergency Medicine

## 2024-07-16 DIAGNOSIS — Z7901 Long term (current) use of anticoagulants: Secondary | ICD-10-CM | POA: Diagnosis not present

## 2024-07-16 DIAGNOSIS — N183 Chronic kidney disease, stage 3 unspecified: Secondary | ICD-10-CM | POA: Diagnosis not present

## 2024-07-16 DIAGNOSIS — I129 Hypertensive chronic kidney disease with stage 1 through stage 4 chronic kidney disease, or unspecified chronic kidney disease: Secondary | ICD-10-CM | POA: Diagnosis not present

## 2024-07-16 DIAGNOSIS — M25562 Pain in left knee: Secondary | ICD-10-CM | POA: Insufficient documentation

## 2024-07-16 DIAGNOSIS — I4891 Unspecified atrial fibrillation: Secondary | ICD-10-CM | POA: Diagnosis not present

## 2024-07-16 LAB — COMPREHENSIVE METABOLIC PANEL WITH GFR
ALT: 18 U/L (ref 0–44)
AST: 26 U/L (ref 15–41)
Albumin: 4.3 g/dL (ref 3.5–5.0)
Alkaline Phosphatase: 64 U/L (ref 38–126)
Anion gap: 11 (ref 5–15)
BUN: 18 mg/dL (ref 8–23)
CO2: 26 mmol/L (ref 22–32)
Calcium: 9.5 mg/dL (ref 8.9–10.3)
Chloride: 103 mmol/L (ref 98–111)
Creatinine, Ser: 1.11 mg/dL (ref 0.61–1.24)
GFR, Estimated: >60 mL/min (ref >60)
Glucose, Bld: 103 mg/dL — ABNORMAL HIGH (ref 70–99)
Potassium: 4.2 mmol/L (ref 3.5–5.1)
Sodium: 140 mmol/L (ref 135–145)
Total Bilirubin: 1 mg/dL (ref 0.0–1.2)
Total Protein: 7.3 g/dL (ref 6.5–8.1)

## 2024-07-16 LAB — CBC WITH DIFFERENTIAL/PLATELET
Abs Immature Granulocytes: 0.09 K/uL — ABNORMAL HIGH (ref 0.00–0.07)
Basophils Absolute: 0.1 K/uL (ref 0.0–0.1)
Basophils Relative: 1 %
Eosinophils Absolute: 0.2 K/uL (ref 0.0–0.5)
Eosinophils Relative: 3 %
HCT: 40.5 % (ref 39.0–52.0)
Hemoglobin: 13.8 g/dL (ref 13.0–17.0)
Immature Granulocytes: 1 %
Lymphocytes Relative: 15 %
Lymphs Abs: 1.2 K/uL (ref 0.7–4.0)
MCH: 31.9 pg (ref 26.0–34.0)
MCHC: 34.1 g/dL (ref 30.0–36.0)
MCV: 93.5 fL (ref 80.0–100.0)
Monocytes Absolute: 0.8 K/uL (ref 0.1–1.0)
Monocytes Relative: 10 %
Neutro Abs: 5.7 K/uL (ref 1.7–7.7)
Neutrophils Relative %: 70 %
Platelets: 160 K/uL (ref 150–400)
RBC: 4.33 MIL/uL (ref 4.22–5.81)
RDW: 12.5 % (ref 11.5–15.5)
WBC: 8 K/uL (ref 4.0–10.5)
nRBC: 0 % (ref 0.0–0.2)

## 2024-07-16 MED ORDER — OXYCODONE HCL 5 MG PO TABS
5.0000 mg | ORAL_TABLET | Freq: Once | ORAL | Status: AC
  Administered 2024-07-16: 5 mg via ORAL
  Filled 2024-07-16: qty 1

## 2024-07-16 MED ORDER — METHOCARBAMOL 500 MG PO TABS
500.0000 mg | ORAL_TABLET | Freq: Once | ORAL | Status: AC
  Administered 2024-07-16: 500 mg via ORAL
  Filled 2024-07-16: qty 1

## 2024-07-16 MED ORDER — OXYCODONE HCL 5 MG PO TABS: 5.0000 mg | ORAL_TABLET | Freq: Three times a day (TID) | ORAL | 0 refills | Status: AC | PRN

## 2024-07-16 MED ORDER — FENTANYL CITRATE PF 50 MCG/ML IJ SOSY
50.0000 ug | PREFILLED_SYRINGE | Freq: Once | INTRAMUSCULAR | Status: AC
  Administered 2024-07-16: 50 ug via INTRAVENOUS
  Filled 2024-07-16: qty 1

## 2024-07-16 MED ORDER — METHOCARBAMOL 500 MG PO TABS
500.0000 mg | ORAL_TABLET | Freq: Three times a day (TID) | ORAL | 0 refills | Status: AC | PRN
Start: 2024-07-16 — End: ?

## 2024-07-16 NOTE — Discharge Instructions (Signed)
 Rest, apply ice over the Ace bandage and knee immobilizer.  Keep leg elevated as often as possible throughout the day.  Call and schedule follow-up appointment with your orthopedist at [facility].  If you develop a fever or other symptoms of concern, return to the emergency department if you are unable to see your specialist or primary care provider right away.

## 2024-07-16 NOTE — ED Triage Notes (Signed)
 Pt states that since yesterday he has been having L knee pain, pain runs up the leg as well, denies injury

## 2024-07-16 NOTE — ED Provider Notes (Signed)
 [facility] Provider Note    Event Date/Time   First MD Initiated Contact with Patient 07/16/24 [phone]     (approximate)   History   Leg Pain   HPI  Johnny [name] is a 73 y.o. male with history of CVA, valvular heart disease, atrial fibrillation on warfarin, hyperlipidemia, hypertension, CKD stage III and as listed in EMR presents to the emergency department for treatment and evaluation of the left knee pain that radiates up into the leg.  No specific injury.  On August 27 he had a dental procedure, but forgot to take the 2g of Amoxicillin as he was supposed to. This knee pain started suddenly 2 days ago and radiates up into the lateral thigh. Yesterday it got to the point he could barely walk. No relief with tylenol . He is concerned that he has a septic joint. He denies chest pain, palpitations, shortness of breath, fever.     Physical Exam    Vitals:   07/16/24 0637 07/16/24 0724  BP: 132/78   Pulse: 65   Resp: 18   Temp: 97.9 F (36.6 C)   SpO2: 97% 97%    General: Awake, no distress.  CV:  Good peripheral perfusion.  Resp:  Normal effort.  Abd:  No distention.  Other:  Left knee without erythema or edema. Surgical scar well healed. No calf pain.        ED Results / Procedures / Treatments   Labs (all labs ordered are listed, but only abnormal results are displayed)  Labs Reviewed  CBC WITH DIFFERENTIAL/PLATELET - Abnormal; Notable for the following components:      Result Value   Abs Immature Granulocytes 0.09 (*)    All other components within normal limits  COMPREHENSIVE METABOLIC PANEL WITH GFR - Abnormal; Notable for the following components:   Glucose, Bld 103 (*)    All other components within normal limits     EKG  Not indicated   RADIOLOGY  Image and radiology report reviewed and interpreted by me. Radiology report consistent with the same.  Post tricompartmental knee replacement without evidence for hardware  complication; small joint effusion  PROCEDURES:  Critical Care performed: No  Procedures   MEDICATIONS ORDERED IN ED:  Medications  methocarbamol  (ROBAXIN ) tablet 500 mg (has no administration in time range)  oxyCODONE  (Oxy IR/ROXICODONE ) immediate release tablet 5 mg (5 mg Oral Given 07/16/24 0813)  fentaNYL  (SUBLIMAZE ) injection 50 mcg (50 mcg Intravenous Given 07/16/24 0937)     IMPRESSION / MDM / ASSESSMENT AND PLAN / ED COURSE   I have reviewed the triage note and vital signs. Vital signs are stable.   Differential diagnosis includes, but is not limited to, knee strain, joint effusion, arthritis, septic joint  Patient's presentation is most consistent with acute illness / injury with system symptoms.  73 year old male presenting to the emergency department for treatment and evaluation of nontraumatic left knee pain that started 2 days ago.  Pain is intermittent but is intense when present.  See HPI for further details.  On exam, there is no swelling or erythema noted.  Patient is able to demonstrate flexion and extension.  No calf pain, swelling, or redness.  Patient is concerned about septic joint due to dental procedure and not taking the amoxicillin therefore CBC and CMP ordered.  X-ray of the left knee shows no acute concerns.  Clinical Course as of 07/16/24 1053  Sat Jul 16, 2024  0906 CBC and CMP are normal.  Consulted with Dr. Cleotilde with orthopedics who recommends ACE bandage, knee immobilizer, and close follow up with patient's orthopedic surgeon at [facility]. Antibiotics not recommended in case the [facility] orthopedist wants to do an arthrocentesis.  [CT]    Clinical Course User Index [CT] Sicilia Killough B, FNP   Patient's pain is better controlled at this time.  He will be discharged home after Ace bandage and knee immobilizer applied.  Outpatient follow-up and ER return precautions were discussed with patient and wife.  FINAL CLINICAL IMPRESSION(S) / ED DIAGNOSES    Final diagnoses:  Acute pain of left knee     Rx / DC Orders   ED Discharge Orders          Ordered    methocarbamol  (ROBAXIN ) 500 MG tablet  Every 8 hours PRN        07/16/24 1041    oxyCODONE  (ROXICODONE ) 5 MG immediate release tablet  Every 8 hours PRN        07/16/24 1041             Note:  This document was prepared using Dragon voice recognition software and may include unintentional dictation errors.   Herlinda Kirk NOVAK, FNP 07/16/24 1053    Arlander Charleston, MD 07/16/24 1101

## 2024-08-11 ENCOUNTER — Encounter: Payer: Self-pay | Admitting: Internal Medicine

## 2024-08-15 ENCOUNTER — Other Ambulatory Visit (HOSPITAL_COMMUNITY): Payer: Self-pay | Admitting: Neurology

## 2024-08-15 DIAGNOSIS — M5416 Radiculopathy, lumbar region: Secondary | ICD-10-CM

## 2024-08-16 ENCOUNTER — Ambulatory Visit (HOSPITAL_COMMUNITY)
Admission: RE | Admit: 2024-08-16 | Discharge: 2024-08-16 | Disposition: A | Discharge status: Home / Self Care | Source: Ambulatory Visit | Attending: Neurology | Admitting: Neurology

## 2024-08-16 DIAGNOSIS — M5416 Radiculopathy, lumbar region: Secondary | ICD-10-CM | POA: Insufficient documentation

## 2024-08-16 NOTE — CV Procedure (Signed)
  Device system confirmed to be MRI conditional, with implant date > 6 weeks ago, and no evidence of abandoned or epicardial leads in review of most recent CXR  Device last cleared by EP Provider: Lesia, on 08/16/24.  Clearance is good through for 1 year as long as parameters remain stable at time of check. If pt undergoes a cardiac device procedure during that time, they should be re-cleared.   Tachy-therapies to be programmed off if applicable with device back to pre-MRI settings after completion of exam.  Medtronic - Programming recommendation received through Medtronic App/Tablet  [employer], RT  08/16/2024 8:25 AM

## 2024-08-16 NOTE — Progress Notes (Signed)
 Patient was monitored by this RN during MRI scan due to presence of a pacemaker. Cardiac rhythm was continuously monitored throughout the procedure. Prior to the start of the scan, the pacemaker was placed in MRI-safe mode by the MRI technician. Following the completion of the scan, the device was returned to its pre-MRI settings. Neurological status and orientation post-procedure were unchanged from baseline.   Pre-procedure Heart Rate (Prior to being placed in MRI safe mode): 61 Intra-MRI: 85 Post-procedure Heart Rate (Once pacemaker is returned to baseline mode): 61

## 2024-09-05 ENCOUNTER — Other Ambulatory Visit: Payer: Self-pay | Admitting: Internal Medicine

## 2024-09-05 DIAGNOSIS — R262 Difficulty in walking, not elsewhere classified: Secondary | ICD-10-CM

## 2024-09-05 DIAGNOSIS — Z96652 Presence of left artificial knee joint: Secondary | ICD-10-CM

## 2024-09-05 DIAGNOSIS — R29898 Other symptoms and signs involving the musculoskeletal system: Secondary | ICD-10-CM

## 2024-09-05 DIAGNOSIS — M25562 Pain in left knee: Secondary | ICD-10-CM
